# Patient Record
Sex: Male | Born: 1955
Health system: Southern US, Community
[De-identification: ages and names within clinical notes are randomized; demographics above are authoritative.]

## PROBLEM LIST (undated history)

## (undated) DIAGNOSIS — E785 Hyperlipidemia, unspecified: Secondary | ICD-10-CM

## (undated) DIAGNOSIS — I219 Acute myocardial infarction, unspecified: Secondary | ICD-10-CM

## (undated) DIAGNOSIS — I2510 Atherosclerotic heart disease of native coronary artery without angina pectoris: Secondary | ICD-10-CM

## (undated) DIAGNOSIS — I10 Essential (primary) hypertension: Secondary | ICD-10-CM

## (undated) HISTORY — DX: Hyperlipidemia, unspecified: E78.5

## (undated) HISTORY — PX: HERNIA REPAIR: SHX51

## (undated) HISTORY — PX: SINUS SURGERY WITH INSTATRAK: SHX5215

## (undated) HISTORY — DX: Acute myocardial infarction, unspecified: I21.9

## (undated) HISTORY — DX: Essential (primary) hypertension: I10

## (undated) HISTORY — PX: EXPLORATION POST OPERATIVE OPEN HEART: SHX5061

## (undated) HISTORY — PX: CORONARY ANGIOPLASTY: SHX604

---

## 2008-09-22 ENCOUNTER — Ambulatory Visit: Payer: Self-pay

## 2009-03-12 ENCOUNTER — Ambulatory Visit: Payer: Self-pay | Admitting: Gastroenterology

## 2010-07-21 IMAGING — RF DG BARIUM SWALLOW
1 series · 15 of 24 positions shown · non-contrast
Comparison: none

REASON FOR EXAM: dysphagia
COMMENTS:

[Series 1: run · 9 acquisitions, 15 frames shown]
[im 1/9]
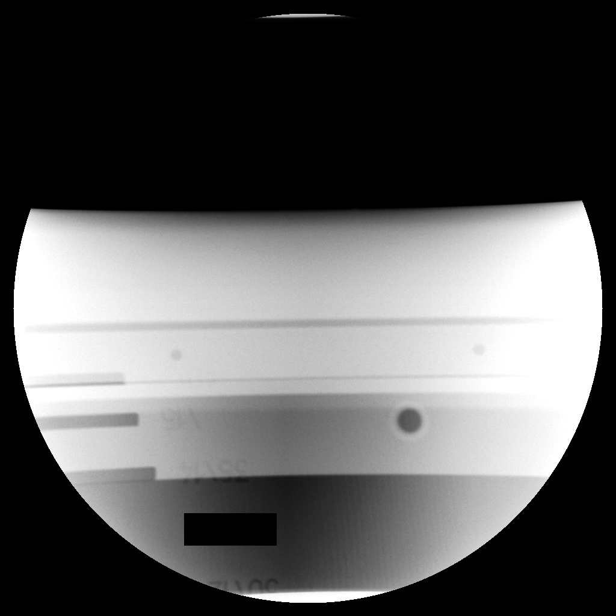
[im 2/9]
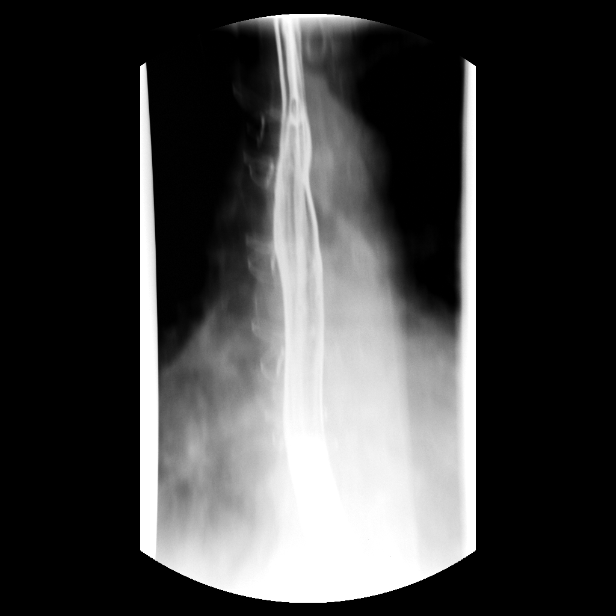
[im 3/9]
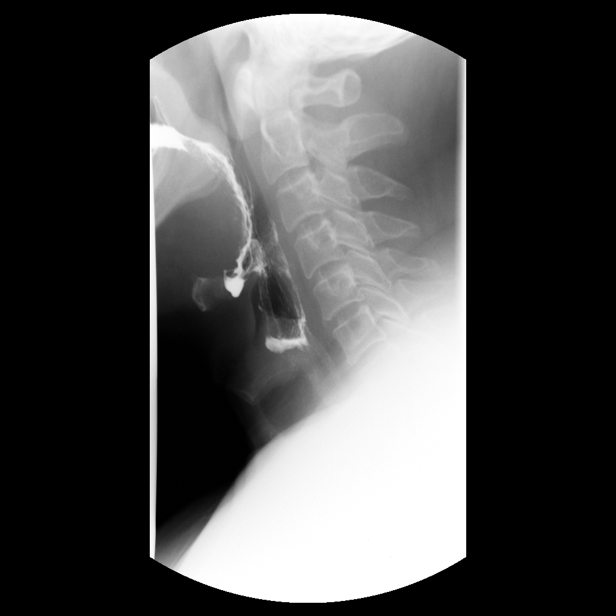
[im 3/9]
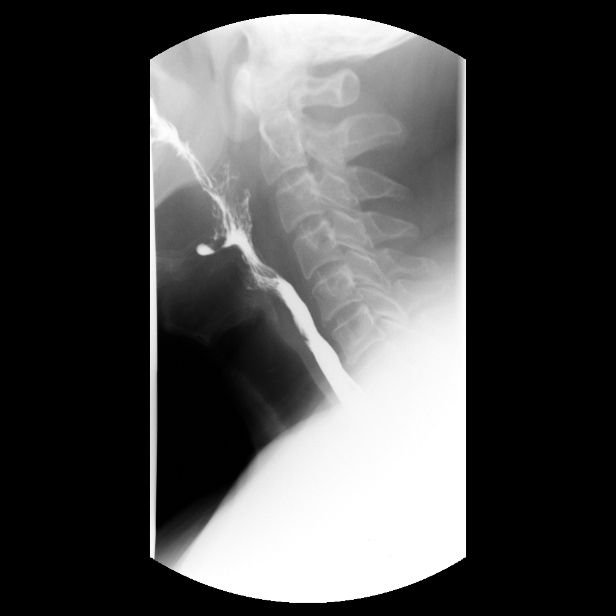
[im 4/9]
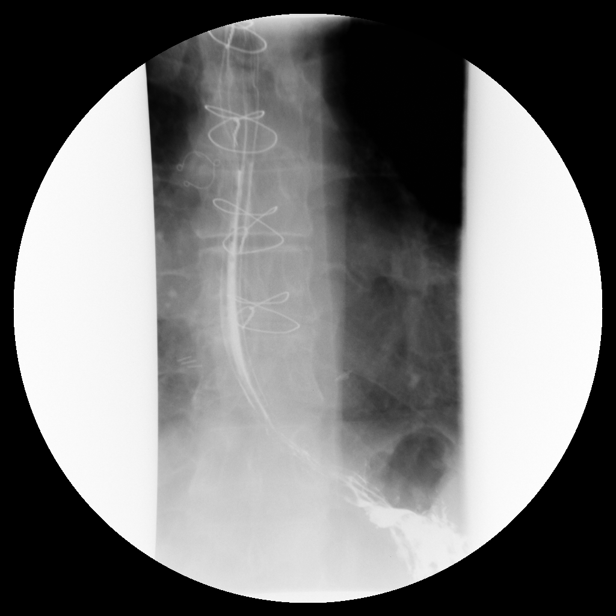
[im 4/9]
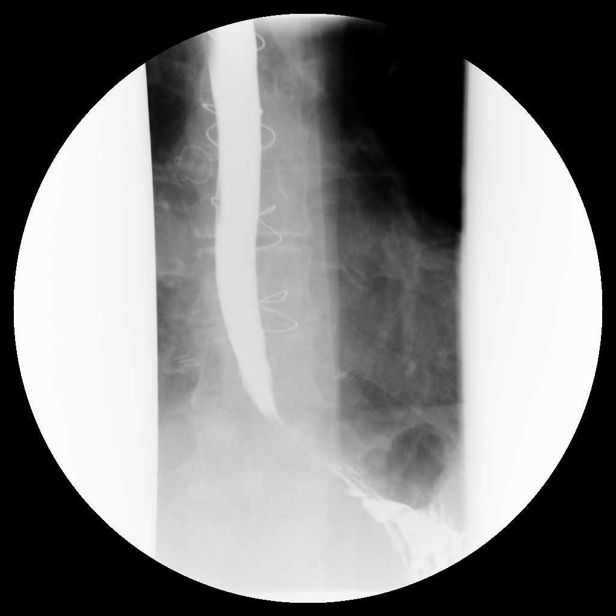
[im 5/9]
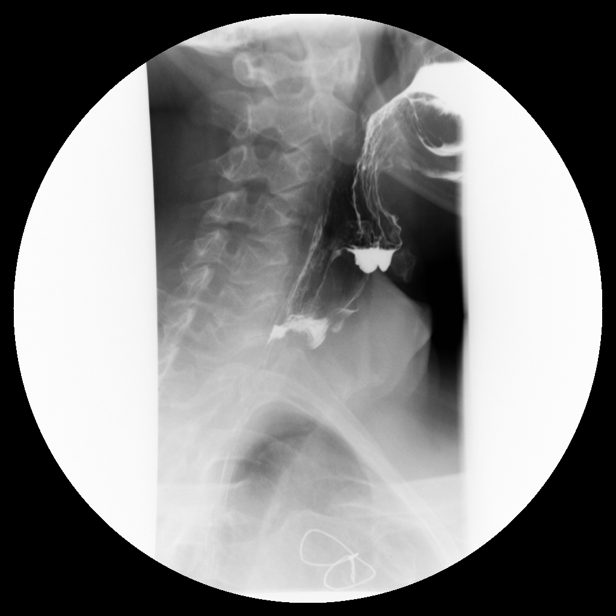
[im 5/9]
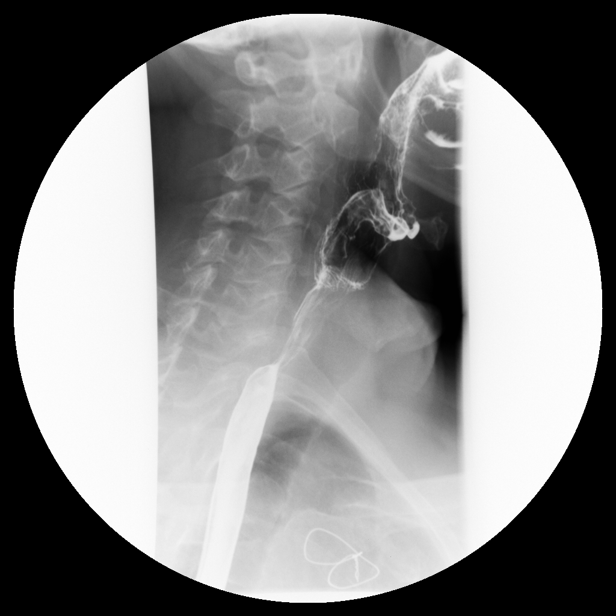
[im 5/9]
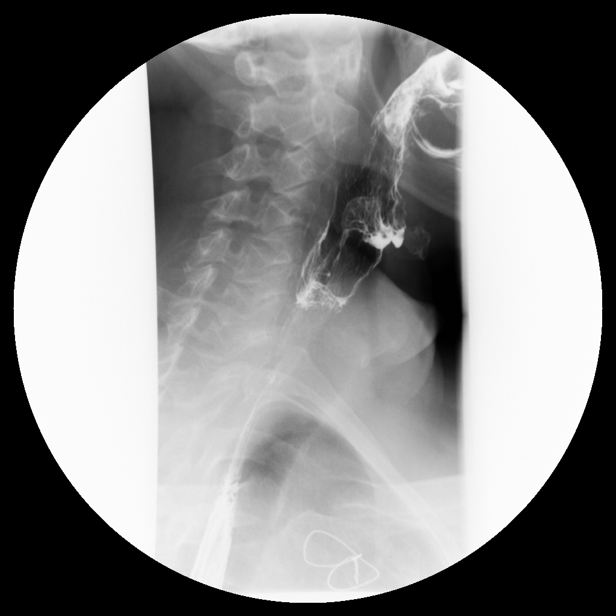
[im 7/9]
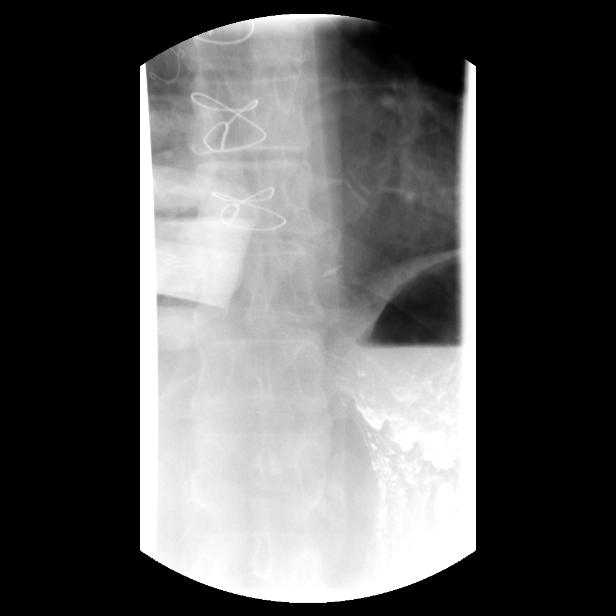
[im 7/9]
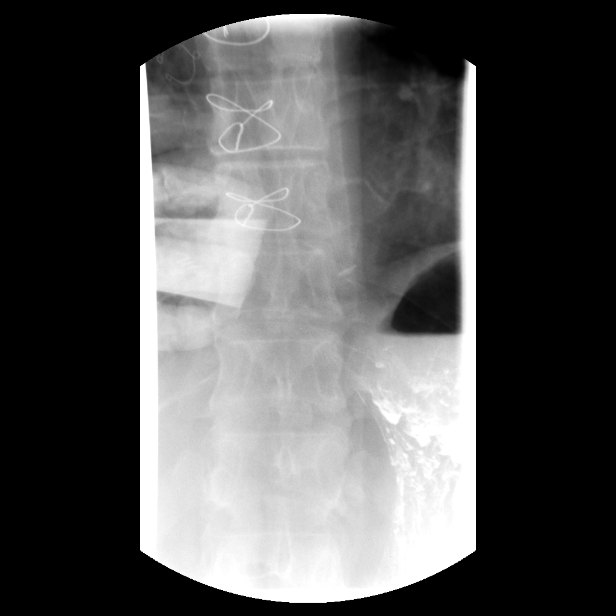
[im 8/9]
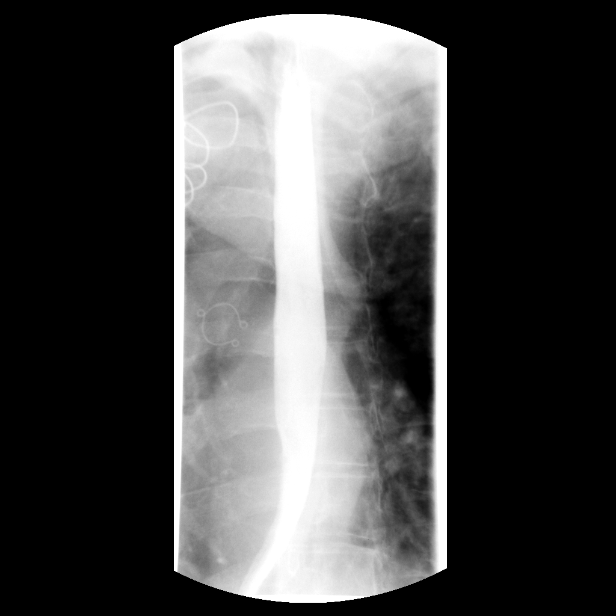
[im 8/9]
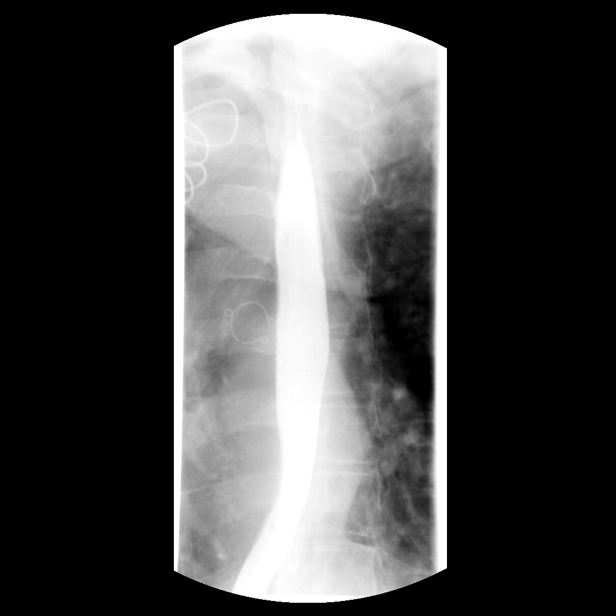
[im 9/9]
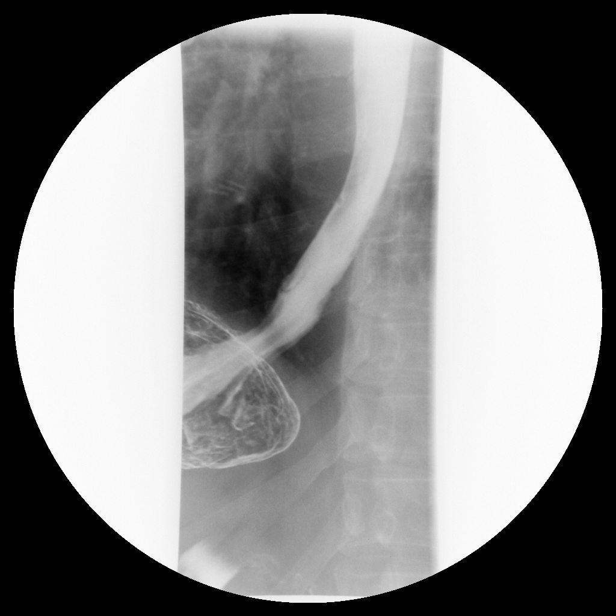
[im 9/9]
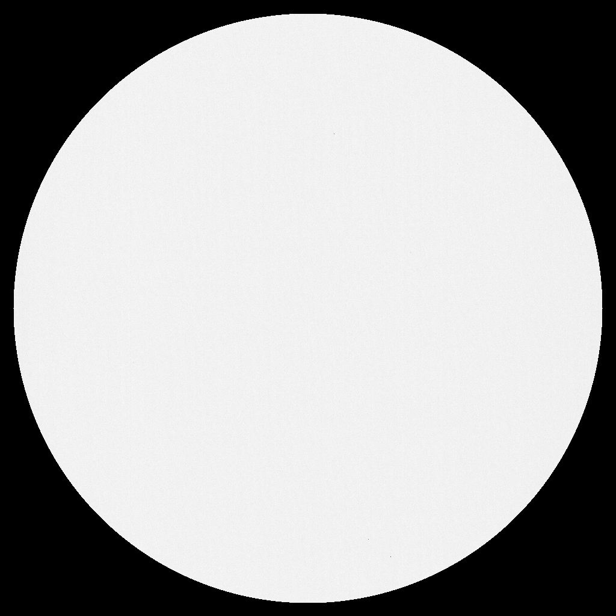

[15 of 24 positions shown; findings below may reference images not displayed]

PROCEDURE:     FL  - FL BARIUM SWALLOW  - September 22, 2008  [DATE]

RESULT:     Barium swallow examination demonstrates the patient easily
ingests the liquid barium. There is no evidence of persistent esophageal
narrowing. There is no evidence of aspiration or penetration of barium into
the upper airway. A 12.5 mm barium impregnated tablet passed easily through
the esophagus into the stomach. There is no evidence of distal stenosis,
hiatal hernia or gastroesophageal reflux.
IMPRESSION: 1.     Unremarkable barium swallow examination. If the patient has
persistent symptoms, consideration could be given for endoscopy.

## 2011-01-08 IMAGING — CR DG CHEST 2V
1 series · 2 of 2 positions shown · non-contrast
Comparison: none

REASON FOR EXAM: esophageal foreign body
COMMENTS:

[Series 1: view not recorded · 0.17mm/px · 2 of 2 slices shown]
[im 1/2]
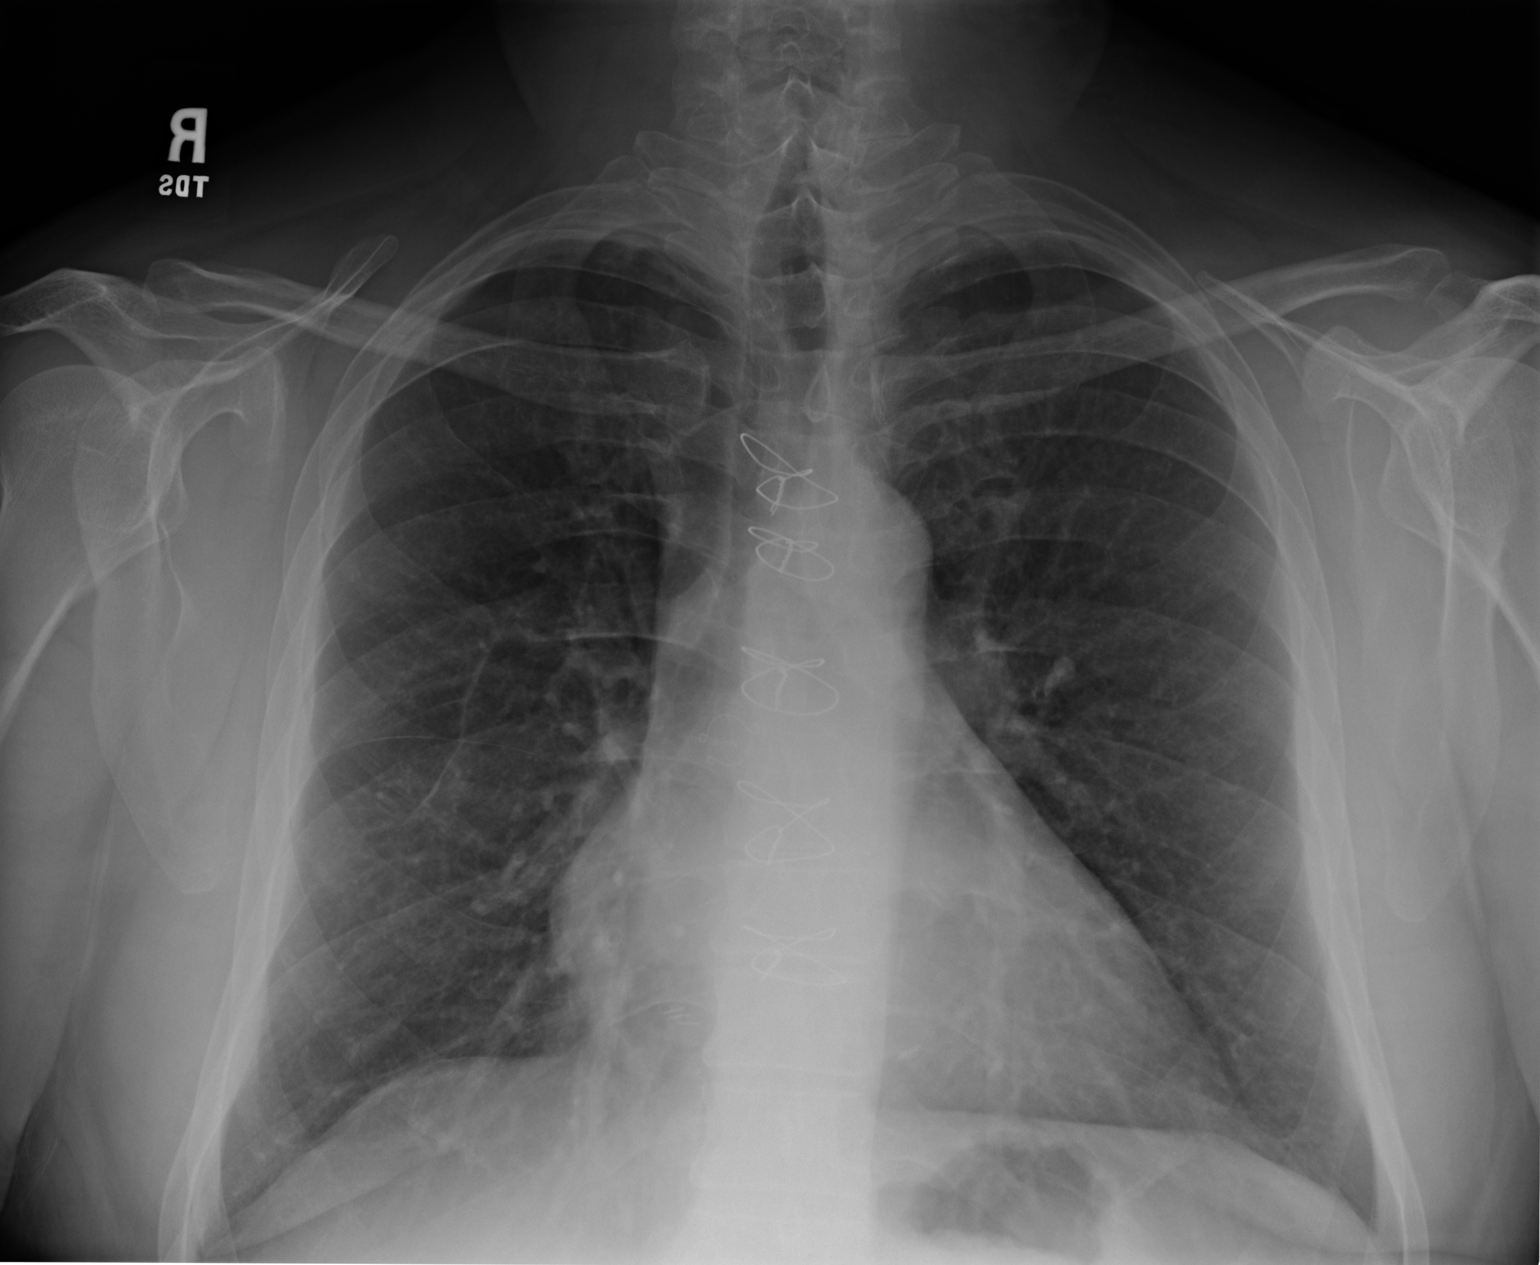
[im 2/2]
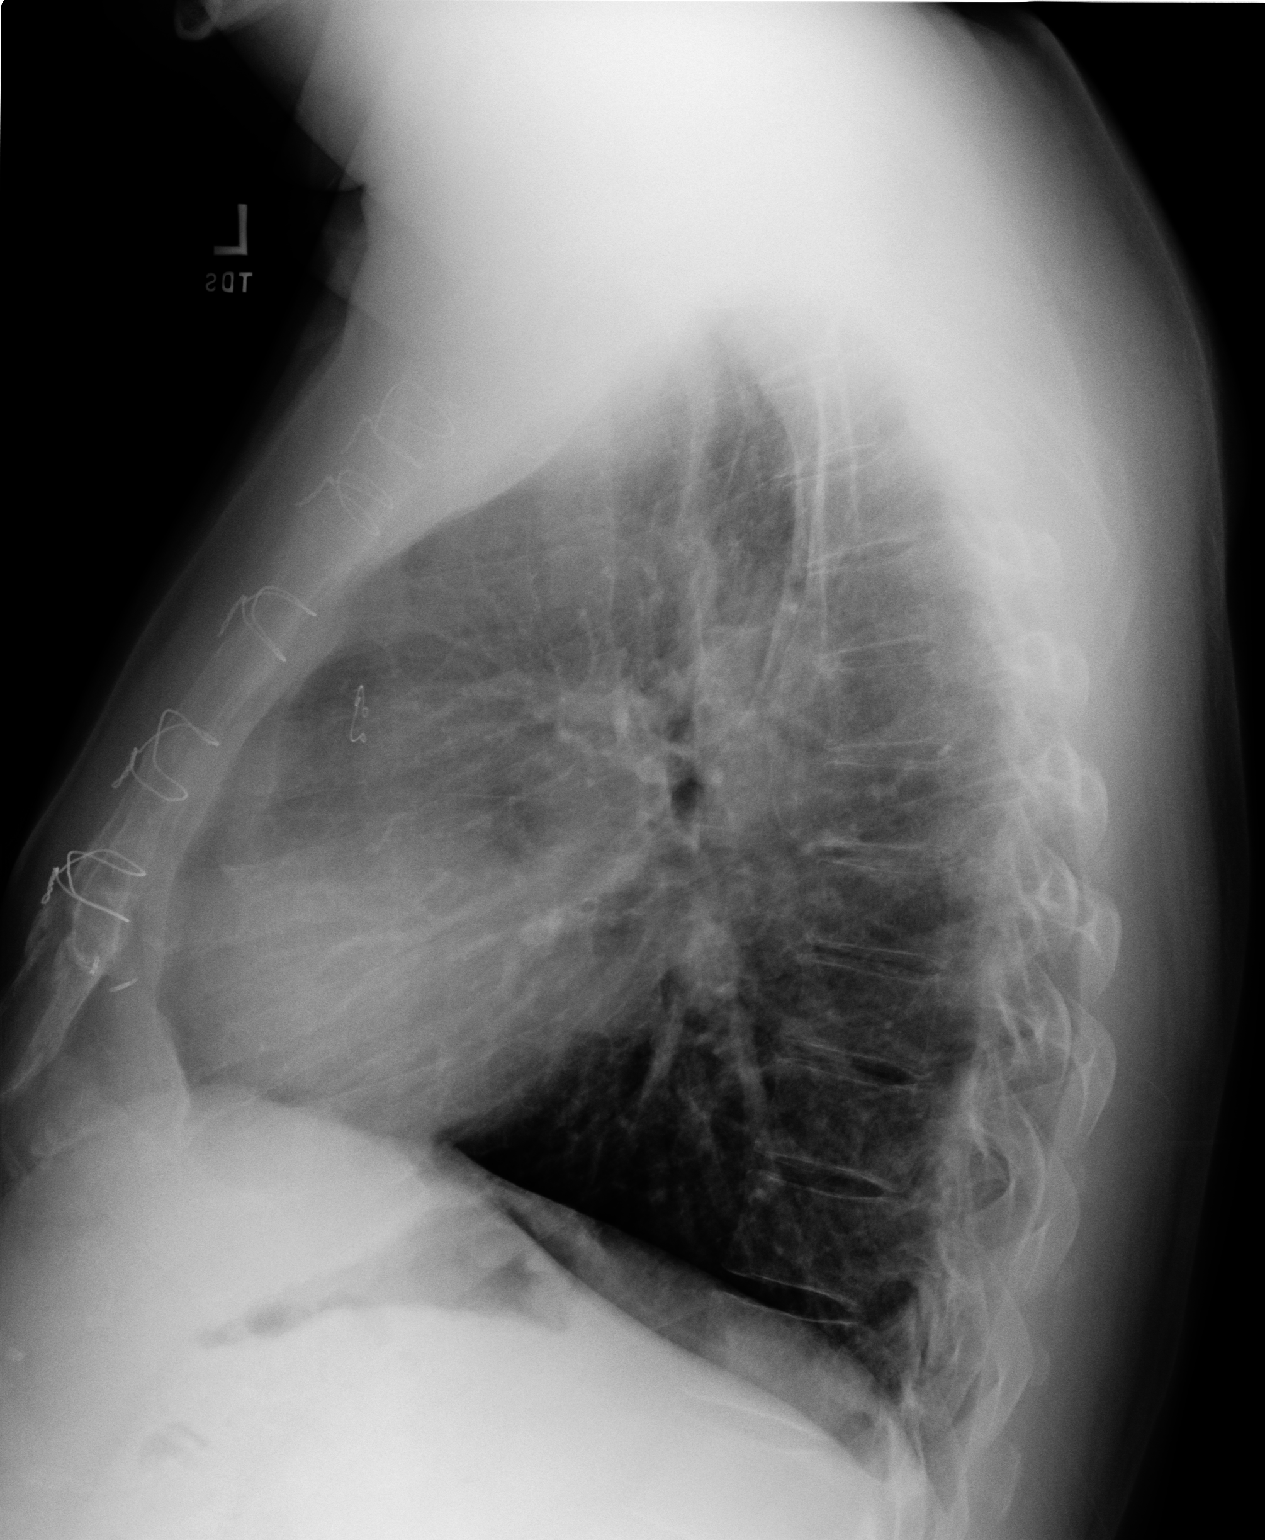

[2 of 2 positions shown; findings below may reference images not displayed]

PROCEDURE:     DXR - DXR CHEST PA (OR AP) AND LATERAL  - March 12, 2009 [DATE]

RESULT:     The lungs are mildly hyperinflated. The interstitial markings
are prominent likely reflecting underlying COPD. The heart is normal in
size. The pulmonary vascularity is not engorged. There is no pleural
effusion. The thoracic vertebral bodies are preserved in height. The patient
has undergone prior median sternotomy.
IMPRESSION: There are findings consistent with COPD. I do not see
evidence of CHF nor of pneumonia.

## 2011-10-15 DIAGNOSIS — E785 Hyperlipidemia, unspecified: Secondary | ICD-10-CM | POA: Insufficient documentation

## 2011-10-15 DIAGNOSIS — I2510 Atherosclerotic heart disease of native coronary artery without angina pectoris: Secondary | ICD-10-CM | POA: Insufficient documentation

## 2014-12-14 ENCOUNTER — Ambulatory Visit: Admit: 2014-12-14 | Disposition: A | Discharge status: Home / Self Care | Payer: Self-pay | Admitting: Gastroenterology

## 2014-12-14 LAB — CBC
HCT: 44.5 % (ref 40.0–52.0)
HGB: 14.8 g/dL (ref 13.0–18.0)
MCH: 30.1 pg (ref 26.0–34.0)
MCHC: 33.3 g/dL (ref 32.0–36.0)
MCV: 90 fL (ref 80–100)
Platelet: 251 10*3/uL (ref 150–440)
RBC: 4.93 10*6/uL (ref 4.40–5.90)
RDW: 12.5 % (ref 11.5–14.5)
WBC: 10.7 10*3/uL — ABNORMAL HIGH (ref 3.8–10.6)

## 2014-12-14 LAB — COMPREHENSIVE METABOLIC PANEL
ALBUMIN: 4.7 g/dL
ALK PHOS: 55 U/L
ALT: 40 U/L
AST: 30 U/L
Anion Gap: 10 (ref 7–16)
BUN: 15 mg/dL
Bilirubin,Total: 0.4 mg/dL
CALCIUM: 9.7 mg/dL
CREATININE: 0.99 mg/dL
Chloride: 105 mmol/L
Co2: 25 mmol/L
EGFR (African American): >60
EGFR (Non-African Amer.): >60
Glucose: 107 mg/dL — ABNORMAL HIGH
Potassium: 3.9 mmol/L
SODIUM: 140 mmol/L
TOTAL PROTEIN: 7.9 g/dL

## 2014-12-14 LAB — PROTIME-INR
INR: 1
Prothrombin Time: 13.4 secs

## 2016-02-19 ENCOUNTER — Telehealth: Payer: Self-pay | Admitting: Podiatry

## 2016-02-19 NOTE — Telephone Encounter (Signed)
Pt will be a new pt and wife states that he has a red and swollen toe

## 2016-02-19 NOTE — Telephone Encounter (Signed)
Pt has a red painful toe.  I told pt to do epsom salt warm water soaks, and cover with a Neosporin bandaid, do this daily until seen.  I told pt I would have an appt scheduler call tomorrow to get him in as soon as possible.  I told pt if he worsened to go to ER or PCP.  Pt states understanding.

## 2016-02-20 ENCOUNTER — Ambulatory Visit (INDEPENDENT_AMBULATORY_CARE_PROVIDER_SITE_OTHER): Payer: 59 | Admitting: Sports Medicine

## 2016-02-20 ENCOUNTER — Encounter: Payer: Self-pay | Admitting: Sports Medicine

## 2016-02-20 DIAGNOSIS — M79674 Pain in right toe(s): Secondary | ICD-10-CM | POA: Diagnosis not present

## 2016-02-20 DIAGNOSIS — L03031 Cellulitis of right toe: Secondary | ICD-10-CM

## 2016-02-20 DIAGNOSIS — L03011 Cellulitis of right finger: Secondary | ICD-10-CM

## 2016-02-20 MED ORDER — AMOXICILLIN-POT CLAVULANATE 875-125 MG PO TABS: 1.0000 | ORAL_TABLET | Freq: Two times a day (BID) | ORAL | Status: DC

## 2016-02-20 NOTE — Progress Notes (Signed)
Patient ID: Lee Douglas, male   DOB: 1956-06-08, 60 y.o.   MRN: [id number] Subjective: Lee Douglas is a 60 y.o. male patient presents to office today complaining of a painful incurvated, red, hot, swollen medial nail border of the 1st toe on the right foot. This has been present for >2 weeks Patient has treated this by trimming, soaking with peroxide, and epsom salt. Patient denies fever/chills/nausea/vomitting/any other related constitutional symptoms at this time.  Patient Active Problem List   Diagnosis Date Noted  . Arteriosclerosis of coronary artery 10/15/2011  . Dyslipidemia 10/15/2011    No current outpatient prescriptions on file prior to visit.   No current facility-administered medications on file prior to visit.    Allergies  Allergen Reactions  . Codeine Nausea Only    Objective:  There were no vitals filed for this visit.  General: Well developed, nourished, in no acute distress, alert and oriented x3   Dermatology: Skin is warm, dry and supple bilateral. Right hallux nail appears to be  severely incurvated with granuloma and hyperkeratosis formation at the distal aspects of  the medial nail border. (+) Erythema. (+) Edema. (-) serosanguous drainage present. The remaining nails appear unremarkable at this time. There are no open sores, lesions or other signs of infection present.  Vascular: Dorsalis Pedis artery and Posterior Tibial artery pedal pulses are 2/4 bilateral with immedate capillary fill time. Pedal hair growth present. No lower extremity edema.   Neruologic: Grossly intact via light touch bilateral.  Musculoskeletal: Tenderness to palpation of the right hallux medial nail fold. Muscular strength within normal limits in all groups bilateral.   Assesement and Plan: Problem List Items Addressed This Visit    None    Visit Diagnoses    Paronychia, right    -  Primary    Hallux medial margin    Relevant Medications    amoxicillin-clavulanate (AUGMENTIN)  875-125 MG tablet    Toe pain, right        Relevant Medications    amoxicillin-clavulanate (AUGMENTIN) 875-125 MG tablet       -Discussed treatment alternatives and plan of care; Explained permanent/temporary nail avulsion and post procedure course to patient. Patient opt for PNA Right hallux medial margin  - After a verbal consent, injected 3 ml of a 50:50 mixture of 2% plain  lidocaine and 0.5% plain marcaine in a normal hallux block fashion. Next, a  betadine prep was performed. Anesthesia was tested and found to be appropriate.  The offending right hallux medial nail border was then incised from the hyponychium to the epinychium. The offending nail border was removed and cleared from the field. The area was curretted for any remaining nail or spicules. Phenol application performed and the area was then flushed with alcohol and dressed with antibiotic cream and a dry sterile dressing. -Patient was instructed to leave the dressing intact for today and begin soaking  in a weak solution of betadine and water tomorrow. Patient was instructed to  soak for 15 minutes each day and apply neosporin and a gauze or bandaid dressing each day. -Patient was instructed to monitor the toe for signs of infection and return to office if toe becomes red, hot or swollen. -Advised ice, elevation, and tylenol or motrin if needed for pain.  -Patient is to return in 1-2 weeks for follow up care or sooner if problems arise.  Landis Martins, DPM

## 2016-02-20 NOTE — Patient Instructions (Signed)

## 2016-03-08 ENCOUNTER — Ambulatory Visit (INDEPENDENT_AMBULATORY_CARE_PROVIDER_SITE_OTHER): Payer: 59 | Admitting: Sports Medicine

## 2016-03-08 ENCOUNTER — Encounter: Payer: Self-pay | Admitting: Sports Medicine

## 2016-03-08 DIAGNOSIS — M79674 Pain in right toe(s): Secondary | ICD-10-CM

## 2016-03-08 DIAGNOSIS — Z9889 Other specified postprocedural states: Secondary | ICD-10-CM

## 2016-03-08 NOTE — Progress Notes (Signed)
Patient ID: THO RADICE, male   DOB: 02-Jun-1956, 60 y.o.   MRN: [id number] Subjective: Lee Douglas is a 60 y.o. male patient returns to office today for follow up evaluation after having Right Hallux medial permanent nail avulsion performed on 02-20-16. Patient has been soaking using betadine and applying topical antibiotic covered with bandaid daily. Patient deniesfever/chills/nausea/vomitting/any other related constitutional symptoms at this time.  Patient Active Problem List   Diagnosis Date Noted  . Arteriosclerosis of coronary artery 10/15/2011  . Dyslipidemia 10/15/2011    Current Outpatient Prescriptions on File Prior to Visit  Medication Sig Dispense Refill  . amoxicillin-clavulanate (AUGMENTIN) 875-125 MG tablet Take 1 tablet by mouth 2 (two) times daily. 20 tablet 0  . aspirin 81 MG tablet Take by mouth.    Marland Kitchen atorvastatin (LIPITOR) 80 MG tablet Take by mouth.    Marland Kitchen atorvastatin (LIPITOR) 80 MG tablet     . clopidogrel (PLAVIX) 75 MG tablet Take by mouth.    . clopidogrel (PLAVIX) 75 MG tablet     . lisinopril (PRINIVIL,ZESTRIL) 10 MG tablet Take by mouth.    Marland Kitchen lisinopril (PRINIVIL,ZESTRIL) 10 MG tablet     . metoprolol tartrate (LOPRESSOR) 25 MG tablet Take by mouth.    . Multiple Vitamins-Minerals (MULTIVITAMIN ADULT PO) Take by mouth.    . nitroGLYCERIN (NITROSTAT) 0.4 MG SL tablet Place under the tongue.    . nitroGLYCERIN (NITROSTAT) 0.4 MG SL tablet     . Omega-3 1000 MG CAPS Take by mouth.     No current facility-administered medications on file prior to visit.    Allergies  Allergen Reactions  . Codeine Nausea Only    Objective:  General: Well developed, nourished, in no acute distress, alert and oriented x3   Dermatology: Skin is warm, dry and supple bilateral. Right hallux medial nail bed appears to be clean, dry, with mild granular tissue and surrounding eschar/scab. (-) Erythema. (-) Edema. (-) serosanguous drainage present. The remaining nails appear  unremarkable at this time. There are no other lesions or other signs of infection  present.  Neurovascular status: Intact. No lower extremity swelling; No pain with calf compression bilateral.  Musculoskeletal: Decreased tenderness to palpation of the Right hallux medial nail fold. Muscular strength within normal limits bilateral.   Assesement and Plan: Problem List Items Addressed This Visit    None    Visit Diagnoses    S/P nail surgery    -  Primary    Toe pain, right          -Examined patient  -Cleansed right hallux medial nail fold and gently scrubbed with peroxide and q-tip/curetted away eschar at site and applied antibiotic cream covered with bandaid.  -Discussed plan of care with patient. -Patient to now begin soaking in a weak solution of Epsom salt and warm water. Patient was instructed to soak for 15-20 minutes each day until the toe appears normal and there is no drainage, redness, tenderness, or swelling at the procedure site, and apply neosporin and a gauze or bandaid dressing each day as needed. May leave open to air at night. -Educated patient on long term care after nail surgery. -Patient was instructed to monitor the toe for reoccurrence and signs of infection; Patient advised to return to office or go to ER if toe becomes red, hot or swollen. -Patient is to return as needed or sooner if problems arise.  Landis Martins, DPM

## 2016-09-24 DIAGNOSIS — I10 Essential (primary) hypertension: Secondary | ICD-10-CM | POA: Diagnosis not present

## 2016-09-24 DIAGNOSIS — I2510 Atherosclerotic heart disease of native coronary artery without angina pectoris: Secondary | ICD-10-CM | POA: Diagnosis not present

## 2016-10-11 IMAGING — CR NECK SOFT TISSUES - 1+ VIEW
1 series · 2 of 2 positions shown · non-contrast
Comparison: None.

CLINICAL DATA: Patient was eating piece if tenderloin and feels
like it stuck at jugular notch region.

EXAM:
NECK SOFT TISSUES - 1+ VIEW

[Series 1: dxr soft tissue neck · 0.14mm/px · 2 of 2 slices shown]
[im 1/2]
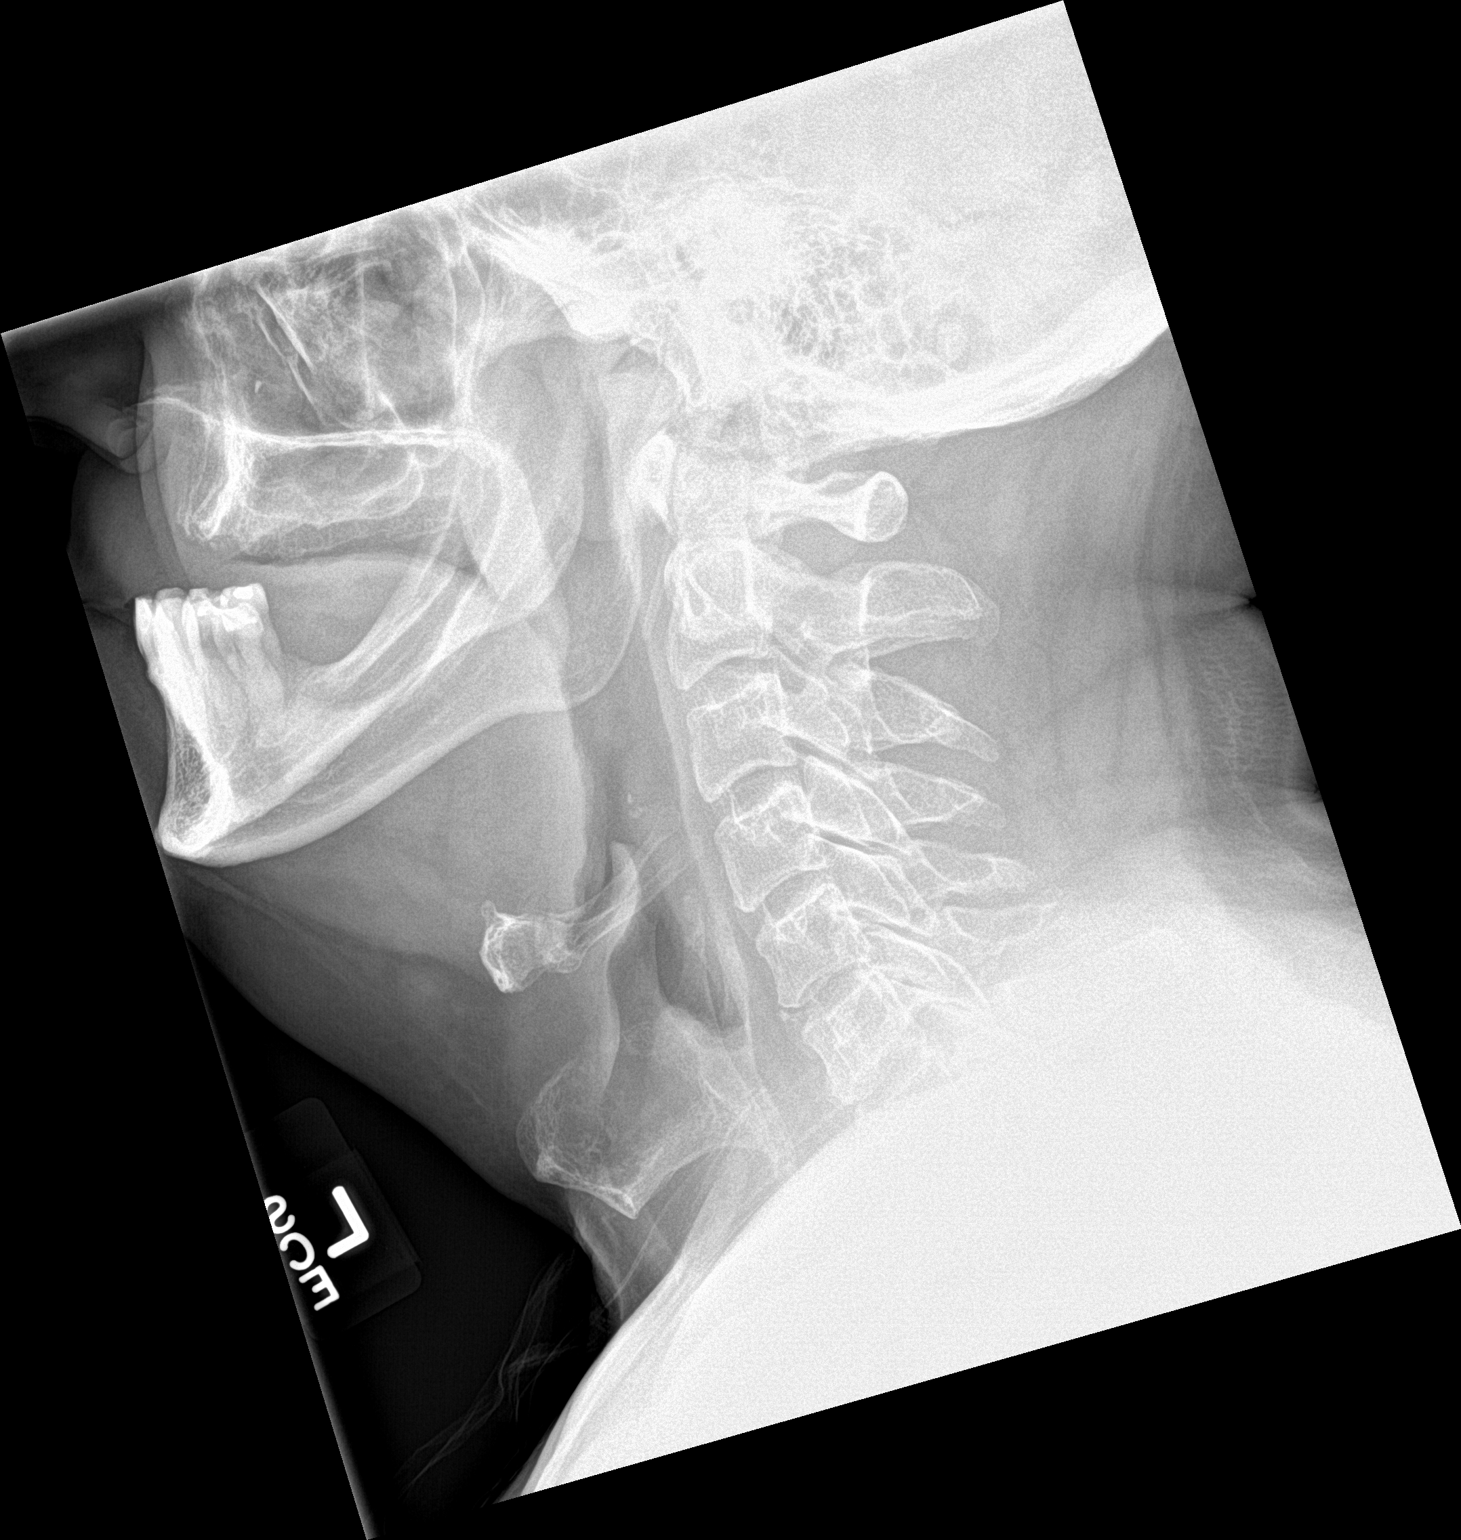
[im 2/2]
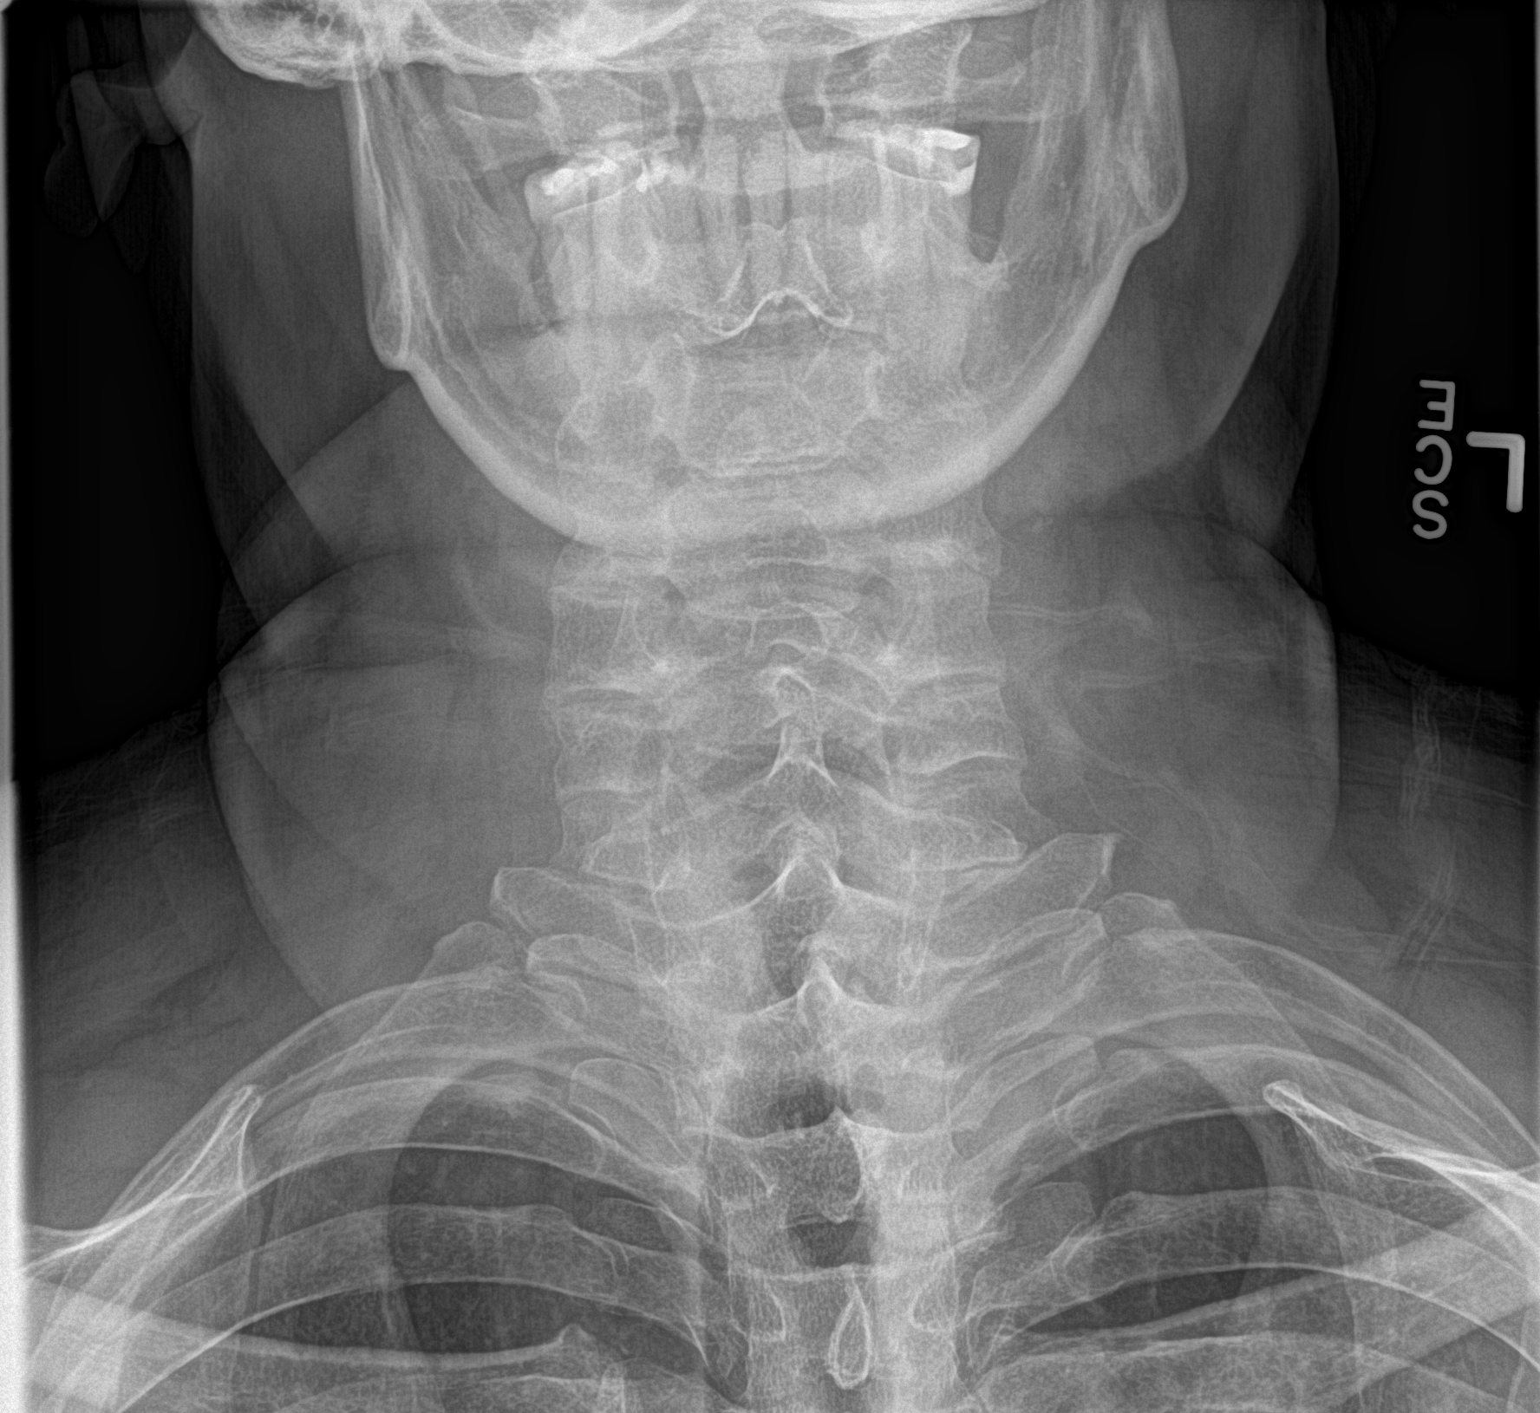

[2 of 2 positions shown; findings below may reference images not displayed]

FINDINGS: There is no evidence of retropharyngeal soft tissue swelling or
epiglottic enlargement. The cervical airway is unremarkable and no
radio-opaque foreign body identified.
IMPRESSION: Negative.

## 2017-01-27 DIAGNOSIS — E785 Hyperlipidemia, unspecified: Secondary | ICD-10-CM | POA: Diagnosis not present

## 2017-01-27 DIAGNOSIS — I25119 Atherosclerotic heart disease of native coronary artery with unspecified angina pectoris: Secondary | ICD-10-CM | POA: Diagnosis not present

## 2017-06-03 DIAGNOSIS — I10 Essential (primary) hypertension: Secondary | ICD-10-CM | POA: Diagnosis not present

## 2017-10-03 ENCOUNTER — Ambulatory Visit: Payer: Self-pay | Admitting: Nurse Practitioner

## 2018-01-23 DIAGNOSIS — E785 Hyperlipidemia, unspecified: Secondary | ICD-10-CM | POA: Diagnosis not present

## 2018-01-23 DIAGNOSIS — I25119 Atherosclerotic heart disease of native coronary artery with unspecified angina pectoris: Secondary | ICD-10-CM | POA: Diagnosis not present

## 2018-02-10 ENCOUNTER — Ambulatory Visit: Payer: 59 | Admitting: Nurse Practitioner

## 2018-02-10 ENCOUNTER — Encounter: Payer: Self-pay | Admitting: Nurse Practitioner

## 2018-02-10 VITALS — BP 108/78 | HR 65 | Temp 98.3°F | Resp 16 | Ht 71.0 in | Wt 263.6 lb

## 2018-02-10 DIAGNOSIS — R05 Cough: Secondary | ICD-10-CM | POA: Diagnosis not present

## 2018-02-10 DIAGNOSIS — R059 Cough, unspecified: Secondary | ICD-10-CM

## 2018-02-10 DIAGNOSIS — J0140 Acute pansinusitis, unspecified: Secondary | ICD-10-CM | POA: Diagnosis not present

## 2018-02-10 DIAGNOSIS — I10 Essential (primary) hypertension: Secondary | ICD-10-CM

## 2018-02-10 MED ORDER — AMOXICILLIN-POT CLAVULANATE 875-125 MG PO TABS: 1.0000 | ORAL_TABLET | Freq: Two times a day (BID) | ORAL | 0 refills | Status: DC

## 2018-02-10 NOTE — Progress Notes (Signed)
Lee Douglas Lee Douglas, Lee Douglas [id number]  Internal MEDICINE  Office Visit Note  Patient Name: Lee Douglas  [id number]  [id number]  Date of Service: 03/01/2018   Pt is here for a sick visit.  Chief Complaint  Patient presents with  . Nasal Congestion  . Cough     Cough  This is a new problem. The current episode started 1 to 4 weeks ago. The problem has been waxing and waning. The problem occurs constantly. The cough is non-productive. Associated symptoms include ear congestion, headaches, nasal congestion, postnasal drip, rhinorrhea and a sore throat. Pertinent negatives include no chest pain, chills, eye redness, fever, rash, shortness of breath or wheezing. The symptoms are aggravated by pollens. He has tried OTC cough suppressant (mucinex) for the symptoms. The treatment provided mild relief. His past medical history is significant for environmental allergies.        Current Medication:  Outpatient Encounter Medications as of 02/10/2018  Medication Sig Note  . aspirin 81 MG tablet Take by mouth. 02/20/2016: Received from: [facility]  . atorvastatin (LIPITOR) 80 MG tablet  02/20/2016: Received from: External Pharmacy  . dextromethorphan (DELSYM) 30 MG/5ML liquid Take by mouth as needed for cough.   Marland Kitchen guaiFENesin (MUCINEX) 600 MG 12 hr tablet Take by mouth 2 (two) times daily.   Marland Kitchen lisinopril (PRINIVIL,ZESTRIL) 10 MG tablet  02/20/2016: Received from: External Pharmacy  . Multiple Vitamins-Minerals (MULTIVITAMIN ADULT PO) Take by mouth. 02/20/2016: Received from: [facility]  . nitroGLYCERIN (NITROSTAT) 0.4 MG SL tablet  02/20/2016: Received from: External Pharmacy  . amoxicillin-clavulanate (AUGMENTIN) 875-125 MG tablet Take 1 tablet by mouth 2 (two) times daily.   Marland Kitchen atorvastatin (LIPITOR) 80 MG tablet Take by mouth. 02/20/2016: Received from: [facility]  . clopidogrel (PLAVIX) 75 MG tablet  02/20/2016:  Received from: External Pharmacy  . lisinopril (PRINIVIL,ZESTRIL) 10 MG tablet Take by mouth. 02/20/2016: Received from: [facility]  . metoprolol tartrate (LOPRESSOR) 25 MG tablet Take by mouth. 02/20/2016: Received from: [facility]  . nitroGLYCERIN (NITROSTAT) 0.4 MG SL tablet Place under the tongue. 02/20/2016: Received from: [facility]  . Omega-3 1000 MG CAPS Take by mouth. 02/20/2016: Received from: [facility]  . [DISCONTINUED] amoxicillin-clavulanate (AUGMENTIN) 875-125 MG tablet Take 1 tablet by mouth 2 (two) times daily.    No facility-administered encounter medications on file as of 02/10/2018.       Medical History: Past Medical History:  Diagnosis Date  . Hyperlipidemia   . Hypertension      Today's Vitals   02/10/18 1118  BP: 108/78  Pulse: 65  Resp: 16  Temp: 98.3 F (36.8 C)  SpO2: 94%  Weight: 263 lb 9.6 oz (119.6 kg)  Height: 5\' 11"  (1.803 m)    Review of Systems  Constitutional: Positive for fatigue. Negative for chills, fever and unexpected weight change.  HENT: Positive for congestion, postnasal drip, rhinorrhea, sneezing and sore throat.   Eyes: Negative.  Negative for redness.  Respiratory: Positive for cough. Negative for chest tightness, shortness of breath and wheezing.   Cardiovascular: Negative for chest pain and palpitations.  Gastrointestinal: Negative for abdominal pain, constipation, diarrhea, nausea and vomiting.  Endocrine: Negative for cold intolerance, heat intolerance, polydipsia, polyphagia and polyuria.  Genitourinary: Negative.  Negative for dysuria and frequency.  Musculoskeletal: Negative for arthralgias, back pain, joint swelling and neck pain.  Skin: Negative for rash.  Allergic/Immunologic:  Positive for environmental allergies.  Neurological: Positive for headaches. Negative for tremors and numbness.  Hematological: Negative for adenopathy. Does not bruise/bleed  easily.  Psychiatric/Behavioral: Negative for behavioral problems (Depression), dysphoric mood, sleep disturbance and suicidal ideas. The patient is not nervous/anxious.     Physical Exam  Constitutional: He is oriented to person, place, and time. He appears well-developed and well-nourished. No distress.  HENT:  Head: Normocephalic and atraumatic.  Right Ear: Tympanic membrane is bulging.  Left Ear: Tympanic membrane is bulging.  Nose: Rhinorrhea present. Right sinus exhibits frontal sinus tenderness. Left sinus exhibits frontal sinus tenderness.  Mouth/Throat: Posterior oropharyngeal erythema present. No oropharyngeal exudate.  Eyes: Pupils are equal, round, and reactive to light. Conjunctivae and EOM are normal.  Neck: Normal range of motion. Neck supple. No JVD present. No tracheal deviation present. No thyromegaly present.  Cardiovascular: Normal rate, regular rhythm and normal heart sounds. Exam reveals no gallop and no friction rub.  No murmur heard. Pulmonary/Chest: Effort normal and breath sounds normal. No respiratory distress. He has no wheezes. He has no rales. He exhibits no tenderness.  Congested,, non-productive cough  Abdominal: Soft. Bowel sounds are normal. There is no tenderness.  Musculoskeletal: Normal range of motion.  Lymphadenopathy:    He has cervical adenopathy.  Neurological: He is alert and oriented to person, place, and time. No cranial nerve deficit.  Skin: Skin is warm and dry. He is not diaphoretic.  Psychiatric: He has a normal mood and affect. His behavior is normal. Judgment and thought content normal.  Nursing note and vitals reviewed.  Assessment/Plan: 1. Acute non-recurrent pansinusitis Start augmentin 875mg  twice daily for 10 days. Rest and increase fluids. Take OTC medication to alleiate symptoms.  - amoxicillin-clavulanate (AUGMENTIN) 875-125 MG tablet; Take 1 tablet by mouth 2 (two) times daily.  Dispense: 20 tablet; Refill: 0  2.  Cough Recommend OTC delsym twice daily as needed for cough.   3. Essential hypertension Generally stable. Continue bp medication as prescribed.   General Counseling: [name] verbalizes understanding of the findings of todays visit and agrees with plan of treatment. I have discussed any further diagnostic evaluation that may be needed or ordered today. We also reviewed his medications today. he has been encouraged to call the office with any questions or concerns that should arise related to todays visit.    Counseling:  Rest and increase fluids. Continue using OTC medication to control symptoms.   This patient was seen by Leretha Pol, FNP- C in Collaboration with Dr Lavera Guise as a part of collaborative care agreement    Meds ordered this encounter  Medications  . amoxicillin-clavulanate (AUGMENTIN) 875-125 MG tablet    Sig: Take 1 tablet by mouth 2 (two) times daily.    Dispense:  20 tablet    Refill:  0    Order Specific Question:   Supervising Provider    Answer:   Lavera Guise [[id number]]    Time spent: 15 Minutes

## 2018-03-01 DIAGNOSIS — J0140 Acute pansinusitis, unspecified: Secondary | ICD-10-CM | POA: Insufficient documentation

## 2018-03-01 DIAGNOSIS — I10 Essential (primary) hypertension: Secondary | ICD-10-CM | POA: Insufficient documentation

## 2018-03-01 DIAGNOSIS — R05 Cough: Secondary | ICD-10-CM | POA: Insufficient documentation

## 2018-03-01 DIAGNOSIS — R059 Cough, unspecified: Secondary | ICD-10-CM | POA: Insufficient documentation

## 2018-06-05 ENCOUNTER — Other Ambulatory Visit: Payer: Self-pay

## 2018-06-05 ENCOUNTER — Encounter: Payer: Self-pay | Admitting: Adult Health

## 2018-06-05 ENCOUNTER — Ambulatory Visit: Payer: 59 | Admitting: Adult Health

## 2018-06-05 VITALS — BP 130/78 | HR 75 | Resp 16 | Ht 71.0 in | Wt 259.0 lb

## 2018-06-05 DIAGNOSIS — I10 Essential (primary) hypertension: Secondary | ICD-10-CM | POA: Diagnosis not present

## 2018-06-05 DIAGNOSIS — F17200 Nicotine dependence, unspecified, uncomplicated: Secondary | ICD-10-CM

## 2018-06-05 DIAGNOSIS — Z0289 Encounter for other administrative examinations: Secondary | ICD-10-CM

## 2018-06-05 DIAGNOSIS — E785 Hyperlipidemia, unspecified: Secondary | ICD-10-CM | POA: Diagnosis not present

## 2018-06-05 DIAGNOSIS — Z1211 Encounter for screening for malignant neoplasm of colon: Secondary | ICD-10-CM

## 2018-06-05 DIAGNOSIS — E669 Obesity, unspecified: Secondary | ICD-10-CM | POA: Diagnosis not present

## 2018-06-05 NOTE — Progress Notes (Signed)
[facility] [place], [place] [address]  Internal MEDICINE  Office Visit Note  Patient Name: Lee Douglas  [id number]  [id number]  Date of Service: 06/05/2018  Chief Complaint  Patient presents with  . Hypertension    health screening form for [facility]  . Hyperlipidemia  . Quality Metric Gaps    colonoscopy needed     HPI PT here for follow up, HLD, HTN. He is also here for health screening paperwork to be filled out for his insurance company.  He denies current issues.  He is due for a colonoscopy at this time, and would like to do Cologuard. He reports he chews tobacco, one or two packs a week.  He reports drinking very little alcohol.  Denies illicit drug use.      Current Medication: Outpatient Encounter Medications as of 06/05/2018  Medication Sig Note  . aspirin 81 MG tablet Take by mouth. 02/20/2016: Received from: [facility]  . atorvastatin (LIPITOR) 80 MG tablet  02/20/2016: Received from: External Pharmacy  . clopidogrel (PLAVIX) 75 MG tablet  02/20/2016: Received from: External Pharmacy  . lisinopril (PRINIVIL,ZESTRIL) 10 MG tablet  02/20/2016: Received from: External Pharmacy  . metoprolol tartrate (LOPRESSOR) 25 MG tablet Take 25 mg by mouth 2 (two) times daily.    . Multiple Vitamins-Minerals (MULTIVITAMIN ADULT PO) Take by mouth. 02/20/2016: Received from: [place]  . nitroGLYCERIN (NITROSTAT) 0.4 MG SL tablet  02/20/2016: Received from: External Pharmacy  . Omega-3 1000 MG CAPS Take by mouth. 02/20/2016: Received from: [place]  . [DISCONTINUED] amoxicillin-clavulanate (AUGMENTIN) 875-125 MG tablet Take 1 tablet by mouth 2 (two) times daily.   . [DISCONTINUED] atorvastatin (LIPITOR) 80 MG tablet Take by mouth. 02/20/2016: Received from: [place]  . [DISCONTINUED] dextromethorphan (DELSYM) 30 MG/5ML liquid Take by mouth as needed for cough.   . [DISCONTINUED] guaiFENesin  (MUCINEX) 600 MG 12 hr tablet Take by mouth 2 (two) times daily.   . [DISCONTINUED] lisinopril (PRINIVIL,ZESTRIL) 10 MG tablet Take by mouth. 02/20/2016: Received from: [place]  . [DISCONTINUED] metoprolol tartrate (LOPRESSOR) 25 MG tablet Take by mouth 2 (two) times daily.  02/20/2016: Received from: [place]  . [DISCONTINUED] nitroGLYCERIN (NITROSTAT) 0.4 MG SL tablet Place under the tongue. 02/20/2016: Received from: [place]   No facility-administered encounter medications on file as of 06/05/2018.     Surgical History: Past Surgical History:  Procedure Laterality Date  . EXPLORATION POST OPERATIVE OPEN HEART    . HERNIA REPAIR    . SINUS SURGERY WITH INSTATRAK      Medical History: Past Medical History:  Diagnosis Date  . Hyperlipidemia   . Hypertension     Family History: Family History  Problem Relation Age of Onset  . Stroke Father     Social History   Socioeconomic History  . Marital status: Married    Spouse name: Not on file  . Number of children: Not on file  . Years of education: Not on file  . Highest education level: Not on file  Occupational History  . Not on file  Social Needs  . Financial resource strain: Not on file  . Food insecurity:    Worry: Not on file    Inability: Not on file  . Transportation needs:    Medical: Not on file    Non-medical: Not on file  Tobacco Use  . Smoking status: Former Research scientist (life sciences)  .  Smokeless tobacco: Current User    Types: Chew  Substance and Sexual Activity  . Alcohol use: Never    Alcohol/week: 0.0 standard drinks    Frequency: Never  . Drug use: Never  . Sexual activity: Not on file  Lifestyle  . Physical activity:    Days per week: Not on file    Minutes per session: Not on file  . Stress: Not on file  Relationships  . Social connections:    Talks on phone: Not on file    Gets together: Not on file    Attends religious service: Not on file     Active member of club or organization: Not on file    Attends meetings of clubs or organizations: Not on file    Relationship status: Not on file  . Intimate partner violence:    Fear of current or ex partner: Not on file    Emotionally abused: Not on file    Physically abused: Not on file    Forced sexual activity: Not on file  Other Topics Concern  . Not on file  Social History Narrative  . Not on file      Review of Systems  Constitutional: Negative.  Negative for chills, fatigue and unexpected weight change.  HENT: Negative.  Negative for congestion, rhinorrhea, sneezing and sore throat.   Eyes: Negative for redness.  Respiratory: Negative.  Negative for cough, chest tightness and shortness of breath.   Cardiovascular: Negative.  Negative for chest pain and palpitations.  Gastrointestinal: Negative.  Negative for abdominal pain, constipation, diarrhea, nausea and vomiting.  Endocrine: Negative.   Genitourinary: Negative.  Negative for dysuria and frequency.  Musculoskeletal: Negative.  Negative for arthralgias, back pain, joint swelling and neck pain.  Skin: Negative.  Negative for rash.  Allergic/Immunologic: Negative.   Neurological: Negative.  Negative for tremors and numbness.  Hematological: Negative for adenopathy. Does not bruise/bleed easily.  Psychiatric/Behavioral: Negative.  Negative for behavioral problems, sleep disturbance and suicidal ideas. The patient is not nervous/anxious.     Vital Signs: BP 130/78   Pulse 75   Resp 16   Ht 5\' 11"  (1.803 m)   Wt 259 lb (117.5 kg)   SpO2 93%   BMI 36.12 kg/m    Physical Exam  Constitutional: He is oriented to person, place, and time. He appears well-developed and well-nourished. No distress.  HENT:  Head: Normocephalic and atraumatic.  Mouth/Throat: Oropharynx is clear and moist. No oropharyngeal exudate.  Eyes: Pupils are equal, round, and reactive to light. EOM are normal.  Neck: Normal range of motion. Neck  supple. No JVD present. No tracheal deviation present. No thyromegaly present.  Cardiovascular: Normal rate, regular rhythm and normal heart sounds. Exam reveals no gallop and no friction rub.  No murmur heard. Pulmonary/Chest: Effort normal and breath sounds normal. No respiratory distress. He has no wheezes. He has no rales. He exhibits no tenderness.  Abdominal: Soft. There is no tenderness. There is no guarding.  Musculoskeletal: Normal range of motion.  Lymphadenopathy:    He has no cervical adenopathy.  Neurological: He is alert and oriented to person, place, and time. No cranial nerve deficit.  Skin: Skin is warm and dry. He is not diaphoretic.  Psychiatric: He has a normal mood and affect. His behavior is normal. Judgment and thought content normal.  Nursing note and vitals reviewed.  Assessment/Plan: 1. Essential hypertension Stable, continue current therapy.   2. Dyslipidemia Continue Lipitor as ordered.    3.  Encounter for completion of form with patient Completed Lab [employer] form.  4. Obesity (BMI 30-39.9) Obesity Counseling: Risk Assessment: An assessment of behavioral risk factors was made today and includes lack of exercise sedentary lifestyle, lack of portion control and poor dietary habits.  Risk Modification Advice: She was counseled on portion control guidelines. Restricting daily caloric intake to. . The detrimental long term effects of obesity on her health and ongoing poor compliance was also discussed with the patient.  5. Tobacco dependence Smoking cessation counseling: 1. Pt acknowledges the risks of long term smoking, she will try to quite smoking. 2. Options for different medications including nicotine products, chewing gum, patch etc, Wellbutrin and Chantix is discussed 3. Goal and date of compete cessation is discussed 4. Total time spent in smoking cessation is 15 min.  6. Encounter for screening colonoscopy - Ambulatory referral to  Gastroenterology  General Counseling: Lee Douglas understanding of the findings of todays visit and agrees with plan of treatment. I have discussed any further diagnostic evaluation that may be needed or ordered today. We also reviewed his medications today. he has been encouraged to call the office with any questions or concerns that should arise related to todays visit.    Orders Placed This Encounter  Procedures  . Ambulatory referral to Gastroenterology    Time spent: 25 Minutes   This patient was seen by Orson Gear AGNP-C in Collaboration with Dr Lavera Guise as a part of collaborative care agreement    Dr Lavera Guise Internal medicine

## 2018-06-05 NOTE — Patient Instructions (Signed)

## 2018-09-04 ENCOUNTER — Ambulatory Visit: Payer: Self-pay | Admitting: Adult Health

## 2018-11-16 DIAGNOSIS — M25561 Pain in right knee: Secondary | ICD-10-CM | POA: Diagnosis not present

## 2020-03-30 ENCOUNTER — Encounter: Payer: Self-pay | Admitting: Nurse Practitioner

## 2020-03-30 ENCOUNTER — Ambulatory Visit: Payer: 59 | Admitting: Nurse Practitioner

## 2020-03-30 ENCOUNTER — Other Ambulatory Visit: Payer: Self-pay

## 2020-03-30 VITALS — BP 131/76 | HR 75 | Temp 97.4°F | Resp 16 | Ht 71.0 in | Wt 268.4 lb

## 2020-03-30 DIAGNOSIS — Z1211 Encounter for screening for malignant neoplasm of colon: Secondary | ICD-10-CM | POA: Diagnosis not present

## 2020-03-30 DIAGNOSIS — E785 Hyperlipidemia, unspecified: Secondary | ICD-10-CM

## 2020-03-30 DIAGNOSIS — I10 Essential (primary) hypertension: Secondary | ICD-10-CM | POA: Diagnosis not present

## 2020-03-30 DIAGNOSIS — K222 Esophageal obstruction: Secondary | ICD-10-CM

## 2020-03-30 DIAGNOSIS — R1310 Dysphagia, unspecified: Secondary | ICD-10-CM | POA: Diagnosis not present

## 2020-04-14 ENCOUNTER — Other Ambulatory Visit: Payer: 59

## 2020-04-17 ENCOUNTER — Other Ambulatory Visit
Admission: RE | Admit: 2020-04-17 | Discharge: 2020-04-17 | Disposition: A | Discharge status: Home / Self Care | Payer: 59 | Source: Ambulatory Visit | Attending: Gastroenterology | Admitting: Gastroenterology

## 2020-04-17 ENCOUNTER — Other Ambulatory Visit: Payer: Self-pay

## 2020-04-17 DIAGNOSIS — Z01812 Encounter for preprocedural laboratory examination: Secondary | ICD-10-CM | POA: Insufficient documentation

## 2020-04-17 DIAGNOSIS — Z20822 Contact with and (suspected) exposure to covid-19: Secondary | ICD-10-CM | POA: Diagnosis not present

## 2020-04-17 LAB — SARS CORONAVIRUS 2 (TAT 6-24 HRS): SARS Coronavirus 2: NEGATIVE

## 2020-04-18 ENCOUNTER — Ambulatory Visit: Payer: 59 | Admitting: Anesthesiology

## 2020-04-18 ENCOUNTER — Other Ambulatory Visit: Payer: Self-pay

## 2020-04-18 ENCOUNTER — Encounter: Payer: Self-pay | Admitting: *Deleted

## 2020-04-18 ENCOUNTER — Encounter
Admission: RE | Disposition: A | Discharge status: Home / Self Care | Payer: Self-pay | Source: Home / Self Care | Attending: Gastroenterology

## 2020-04-18 ENCOUNTER — Ambulatory Visit
Admission: RE | Admit: 2020-04-18 | Discharge: 2020-04-18 | Disposition: A | Discharge status: Home / Self Care | Payer: 59 | Attending: Gastroenterology | Admitting: Gastroenterology

## 2020-04-18 DIAGNOSIS — Z1211 Encounter for screening for malignant neoplasm of colon: Secondary | ICD-10-CM | POA: Diagnosis present

## 2020-04-18 DIAGNOSIS — Z7982 Long term (current) use of aspirin: Secondary | ICD-10-CM | POA: Insufficient documentation

## 2020-04-18 DIAGNOSIS — K317 Polyp of stomach and duodenum: Secondary | ICD-10-CM | POA: Diagnosis not present

## 2020-04-18 DIAGNOSIS — Z87891 Personal history of nicotine dependence: Secondary | ICD-10-CM | POA: Insufficient documentation

## 2020-04-18 DIAGNOSIS — K640 First degree hemorrhoids: Secondary | ICD-10-CM | POA: Insufficient documentation

## 2020-04-18 DIAGNOSIS — K5730 Diverticulosis of large intestine without perforation or abscess without bleeding: Secondary | ICD-10-CM | POA: Diagnosis not present

## 2020-04-18 DIAGNOSIS — R1310 Dysphagia, unspecified: Secondary | ICD-10-CM | POA: Insufficient documentation

## 2020-04-18 DIAGNOSIS — Z885 Allergy status to narcotic agent status: Secondary | ICD-10-CM | POA: Insufficient documentation

## 2020-04-18 DIAGNOSIS — I10 Essential (primary) hypertension: Secondary | ICD-10-CM | POA: Insufficient documentation

## 2020-04-18 DIAGNOSIS — D125 Benign neoplasm of sigmoid colon: Secondary | ICD-10-CM | POA: Diagnosis not present

## 2020-04-18 DIAGNOSIS — Z7902 Long term (current) use of antithrombotics/antiplatelets: Secondary | ICD-10-CM | POA: Insufficient documentation

## 2020-04-18 DIAGNOSIS — D128 Benign neoplasm of rectum: Secondary | ICD-10-CM | POA: Diagnosis not present

## 2020-04-18 DIAGNOSIS — Z79899 Other long term (current) drug therapy: Secondary | ICD-10-CM | POA: Diagnosis not present

## 2020-04-18 DIAGNOSIS — E785 Hyperlipidemia, unspecified: Secondary | ICD-10-CM | POA: Insufficient documentation

## 2020-04-18 HISTORY — PX: COLONOSCOPY WITH PROPOFOL: SHX5780

## 2020-04-18 HISTORY — PX: ESOPHAGOGASTRODUODENOSCOPY (EGD) WITH PROPOFOL: SHX5813

## 2020-04-18 SURGERY — ESOPHAGOGASTRODUODENOSCOPY (EGD) WITH PROPOFOL
Anesthesia: General

## 2020-04-18 MED ORDER — PROPOFOL 500 MG/50ML IV EMUL
INTRAVENOUS | Status: DC | PRN
  Administered 2020-04-18: 120 ug/kg/min via INTRAVENOUS

## 2020-04-18 MED ORDER — SODIUM CHLORIDE 0.9 % IV SOLN
INTRAVENOUS | Status: DC
  Administered 2020-04-18: 1000 mL via INTRAVENOUS

## 2020-04-18 MED ORDER — PROPOFOL 10 MG/ML IV BOLUS
INTRAVENOUS | Status: AC
  Filled 2020-04-18: qty 20

## 2020-04-18 MED ORDER — PROPOFOL 10 MG/ML IV BOLUS
INTRAVENOUS | Status: DC | PRN
  Administered 2020-04-18: 30 mg via INTRAVENOUS
  Administered 2020-04-18: 100 mg via INTRAVENOUS

## 2020-04-18 MED ORDER — LIDOCAINE HCL (CARDIAC) PF 100 MG/5ML IV SOSY
PREFILLED_SYRINGE | INTRAVENOUS | Status: DC | PRN
  Administered 2020-04-18: 60 mg via INTRAVENOUS

## 2020-04-18 NOTE — Op Note (Signed)
[facility] Gastroenterology Patient Name: Lee Douglas Procedure Date: 04/18/2020 12:50 PM MRN: [id number] Account #: [id number] Date of Birth: [birth year] Admit Type: Outpatient Age: 64 Room: [facility] ENDO ROOM 3 Gender: Male Note Status: Finalized Procedure:             Colonoscopy Indications:           Screening for colorectal malignant neoplasm Providers:             Andrey Farmer MD, MD Medicines:             Monitored Anesthesia Care Complications:         No immediate complications. Estimated blood loss:                         Minimal. Procedure:             Pre-Anesthesia Assessment:                        - Prior to the procedure, a History and Physical was                         performed, and patient medications and allergies were                         reviewed. The patient is competent. The risks and                         benefits of the procedure and the sedation options and                         risks were discussed with the patient. All questions                         were answered and informed consent was obtained.                         Patient identification and proposed procedure were                         verified by the physician, the nurse, the anesthetist                         and the technician in the pre-procedure area in the                         endoscopy suite. Mental Status Examination: alert and                         oriented. Airway Examination: normal oropharyngeal                         airway and neck mobility. Respiratory Examination:                         clear to auscultation. CV Examination: normal.                         Prophylactic Antibiotics: The patient does not require  prophylactic antibiotics. Prior Anticoagulants: The                         patient has taken Plavix (clopidogrel), last dose was                         5 days prior to procedure. ASA Grade Assessment: III -                          A patient with severe systemic disease. After                         reviewing the risks and benefits, the patient was                         deemed in satisfactory condition to undergo the                         procedure. The anesthesia plan was to use monitored                         anesthesia care (MAC). Immediately prior to                         administration of medications, the patient was                         re-assessed for adequacy to receive sedatives. The                         heart rate, respiratory rate, oxygen saturations,                         blood pressure, adequacy of pulmonary ventilation, and                         response to care were monitored throughout the                         procedure. The physical status of the patient was                         re-assessed after the procedure.                        After obtaining informed consent, the colonoscope was                         passed under direct vision. Throughout the procedure,                         the patient's blood pressure, pulse, and oxygen                         saturations were monitored continuously. The                         Colonoscope was introduced through the anus and  advanced to the the cecum, identified by appendiceal                         orifice and ileocecal valve. The colonoscopy was                         performed without difficulty. The patient tolerated                         the procedure well. The quality of the bowel                         preparation was good. Findings:      The perianal and digital rectal examinations were normal.      A few small-mouthed diverticula were found in the entire colon.      A 3 mm polyp was found in the descending colon. The polyp was sessile.       The polyp was removed with a cold snare. Resection and retrieval were       complete. Estimated blood loss was minimal.      A 2  mm polyp was found in the sigmoid colon. The polyp was sessile. The       polyp was removed with a jumbo cold forceps. Resection and retrieval       were complete. Estimated blood loss was minimal.      A 5 mm polyp was found in the rectum. The polyp was sessile. The polyp       was removed with a cold snare. Resection and retrieval were complete.       Estimated blood loss was minimal.      Non-bleeding internal hemorrhoids were found during retroflexion. The       hemorrhoids were Grade I (internal hemorrhoids that do not prolapse).      The exam was otherwise without abnormality on direct and retroflexion       views. Impression:            - Diverticulosis in the entire examined colon.                        - One 3 mm polyp in the descending colon, removed with                         a cold snare. Resected and retrieved.                        - One 2 mm polyp in the sigmoid colon, removed with a                         jumbo cold forceps. Resected and retrieved.                        - One 5 mm polyp in the rectum, removed with a cold                         snare. Resected and retrieved.                        - Non-bleeding internal hemorrhoids.                        -  The examination was otherwise normal on direct and                         retroflexion views. Recommendation:        - Discharge patient to home.                        - Resume previous diet.                        - Continue present medications.                        - Await pathology results.                        - Resume Plavix (clopidogrel) at prior dose tomorrow.                        - Repeat colonoscopy for surveillance based on                         pathology results.                        - Return to referring physician as previously                         scheduled. Procedure Code(s):     --- Professional ---                        [phone], Colonoscopy, flexible; with removal of                          tumor(s), polyp(s), or other lesion(s) by snare                         technique                        [id number], 34, Colonoscopy, flexible; with biopsy, single                         or multiple Diagnosis Code(s):     --- Professional ---                        Z12.11, Encounter for screening for malignant neoplasm                         of colon                        K64.0, First degree hemorrhoids                        K63.5, Polyp of colon                        K62.1, Rectal polyp                        K57.30, Diverticulosis of large intestine without  perforation or abscess without bleeding CPT copyright 2019 American Medical Association. All rights reserved. The codes documented in this report are preliminary and upon coder review may  be revised to meet current compliance requirements. Andrey Farmer, MD Andrey Farmer MD, MD 04/18/2020 1:38:41 PM Number of Addenda: 0 Note Initiated On: 04/18/2020 12:50 PM Scope Withdrawal Time: 0 hours 15 minutes 39 seconds  Total Procedure Duration: 0 hours 21 minutes 44 seconds  Estimated Blood Loss:  Estimated blood loss was minimal.      [facility]

## 2020-04-18 NOTE — Op Note (Signed)
[facility] Gastroenterology Patient Name: Lee Douglas Procedure Date: 04/18/2020 12:51 PM MRN: [id number] Account #: [id number] Date of Birth: [birth year] Admit Type: Outpatient Age: 64 Room: [facility] ENDO ROOM 3 Gender: Male Note Status: Finalized Procedure:             Upper GI endoscopy Indications:           Dysphagia Providers:             Andrey Farmer MD, MD Medicines:             Monitored Anesthesia Care Complications:         No immediate complications. Estimated blood loss:                         Minimal. Procedure:             Pre-Anesthesia Assessment:                        - Prior to the procedure, a History and Physical was                         performed, and patient medications and allergies were                         reviewed. The patient is competent. The risks and                         benefits of the procedure and the sedation options and                         risks were discussed with the patient. All questions                         were answered and informed consent was obtained.                         Patient identification and proposed procedure were                         verified by the physician, the nurse, the anesthetist                         and the technician in the endoscopy suite. Mental                         Status Examination: alert and oriented. Airway                         Examination: normal oropharyngeal airway and neck                         mobility. Respiratory Examination: clear to                         auscultation. CV Examination: normal. Prophylactic                         Antibiotics: The patient does not require prophylactic  antibiotics. Prior Anticoagulants: The patient has                         taken Plavix (clopidogrel), last dose was 5 days prior                         to procedure. ASA Grade Assessment: III - A patient                         with severe systemic  disease. After reviewing the                         risks and benefits, the patient was deemed in                         satisfactory condition to undergo the procedure. The                         anesthesia plan was to use monitored anesthesia care                         (MAC). Immediately prior to administration of                         medications, the patient was re-assessed for adequacy                         to receive sedatives. The heart rate, respiratory                         rate, oxygen saturations, blood pressure, adequacy of                         pulmonary ventilation, and response to care were                         monitored throughout the procedure. The physical                         status of the patient was re-assessed after the                         procedure.                        After obtaining informed consent, the endoscope was                         passed under direct vision. Throughout the procedure,                         the patient's blood pressure, pulse, and oxygen                         saturations were monitored continuously. The Endoscope                         was introduced through the mouth, and advanced to the  second part of duodenum. The upper GI endoscopy was                         accomplished without difficulty. The patient tolerated                         the procedure well. Findings:      The examined esophagus was normal. Biopsies were obtained from the       proximal and distal esophagus with cold forceps for histology of       suspected eosinophilic esophagitis. Estimated blood loss was minimal.      A single 1 mm sessile polyp with no stigmata of recent bleeding was       found in the cardia. The polyp was removed with a cold biopsy forceps.       Resection and retrieval were complete.      The examined duodenum was normal. Impression:            - Normal esophagus. Biopsied.                         - A single gastric polyp. Resected and retrieved.                        - Normal examined duodenum. Recommendation:        - Discharge patient to home.                        - Resume previous diet.                        - Continue present medications.                        - Await pathology results.                        - Resume Plavix (clopidogrel) at prior dose tomorrow.                        - Perform a colonoscopy today. Procedure Code(s):     --- Professional ---                        [phone], Esophagogastroduodenoscopy, flexible,                         transoral; with biopsy, single or multiple Diagnosis Code(s):     --- Professional ---                        K31.7, Polyp of stomach and duodenum                        R13.10, Dysphagia, unspecified CPT copyright 2019 American Medical Association. All rights reserved. The codes documented in this report are preliminary and upon coder review may  be revised to meet current compliance requirements. Andrey Farmer, MD Andrey Farmer MD, MD 04/18/2020 1:33:24 PM Number of Addenda: 0 Note Initiated On: 04/18/2020 12:51 PM Estimated Blood Loss:  Estimated blood loss was minimal.      [facility]

## 2020-04-18 NOTE — Anesthesia Preprocedure Evaluation (Signed)
Anesthesia Evaluation  Patient identified by MRN, date of birth, ID band Patient awake    Reviewed: Allergy & Precautions, NPO status , Patient's Chart, lab work & pertinent test results  History of Anesthesia Complications Negative for: history of anesthetic complications  Airway Mallampati: III  TM Distance: >3 FB Neck ROM: Full    Dental  (+) Upper Dentures, Poor Dentition, Missing   Pulmonary neg sleep apnea, neg COPD, former smoker,    breath sounds clear to auscultation- rhonchi (-) wheezing      Cardiovascular hypertension, Pt. on medications + CAD and + CABG  (-) Cardiac Stents  Rhythm:Regular Rate:Normal - Systolic murmurs and - Diastolic murmurs    Neuro/Psych neg Seizures negative neurological ROS  negative psych ROS   GI/Hepatic negative GI ROS, Neg liver ROS,   Endo/Other  negative endocrine ROSneg diabetes  Renal/GU negative Renal ROS     Musculoskeletal negative musculoskeletal ROS (+)   Abdominal (+) + obese,   Peds  Hematology negative hematology ROS (+)   Anesthesia Other Findings Past Medical History: No date: Hyperlipidemia No date: Hypertension   Reproductive/Obstetrics                             Anesthesia Physical Anesthesia Plan  ASA: III  Anesthesia Plan: General   Post-op Pain Management:    Induction: Intravenous  PONV Risk Score and Plan: 1 and Propofol infusion  Airway Management Planned: Natural Airway  Additional Equipment:   Intra-op Plan:   Post-operative Plan:   Informed Consent: I have reviewed the patients History and Physical, chart, labs and discussed the procedure including the risks, benefits and alternatives for the proposed anesthesia with the patient or authorized representative who has indicated his/her understanding and acceptance.     Dental advisory given  Plan Discussed with: CRNA and Anesthesiologist  Anesthesia  Plan Comments:         Anesthesia Quick Evaluation

## 2020-04-18 NOTE — Anesthesia Postprocedure Evaluation (Signed)
Anesthesia Post Note  Patient: Lee Douglas  Procedure(s) Performed: ESOPHAGOGASTRODUODENOSCOPY (EGD) WITH PROPOFOL (N/A ) COLONOSCOPY WITH PROPOFOL (N/A )  Patient location during evaluation: Endoscopy Anesthesia Type: General Level of consciousness: awake and alert and oriented Pain management: pain level controlled Vital Signs Assessment: post-procedure vital signs reviewed and stable Respiratory status: spontaneous breathing, nonlabored ventilation and respiratory function stable Cardiovascular status: blood pressure returned to baseline and stable Postop Assessment: no signs of nausea or vomiting Anesthetic complications: no   No complications documented.   Last Vitals:  Vitals:   04/18/20 1353 04/18/20 1358  BP: 135/75 (!) 149/84  Pulse: (!) 57 (!) 53  Resp: 14 13  Temp:    SpO2: 100% 100%    Last Pain:  Vitals:   04/18/20 1358  TempSrc:   PainSc: 0-No pain                 Chanoch Mccleery

## 2020-04-18 NOTE — H&P (Signed)
Outpatient short stay form Pre-procedure 04/18/2020 12:39 PM Raylene Miyamoto MD, MPH  Primary Physician: NP Junius Creamer  Reason for visit:  Dysphagia/Screening  History of present illness:   64 y/o gentleman with history of dysphagia here for EGD and screening colonoscopy. Last took plavix 5 days ago. No abdominal surgeries. No family history of GI malignancies.    Current Facility-Administered Medications:  .  0.9 %  sodium chloride infusion, , Intravenous, Continuous, Jhace Fennell, Hilton Cork, MD, Last Rate: 20 mL/hr at 04/18/20 1238, 1,000 mL at 04/18/20 1238  Medications Prior to Admission  Medication Sig Dispense Refill Last Dose  . aspirin 81 MG tablet Take by mouth.   Past Week at Unknown time  . atorvastatin (LIPITOR) 80 MG tablet    04/17/2020 at Unknown time  . clopidogrel (PLAVIX) 75 MG tablet    Past Week at Unknown time  . lisinopril (PRINIVIL,ZESTRIL) 10 MG tablet    04/18/2020 at 0600Unknown time  . Multiple Vitamins-Minerals (MULTIVITAMIN ADULT PO) Take by mouth.   04/17/2020 at Unknown time  . Omega-3 1000 MG CAPS Take by mouth.   04/17/2020 at Unknown time  . nitroGLYCERIN (NITROSTAT) 0.4 MG SL tablet  (Patient not taking: Reported on 04/18/2020)   Not Taking at Unknown time     Allergies  Allergen Reactions  . Codeine Nausea Only     Past Medical History:  Diagnosis Date  . Hyperlipidemia   . Hypertension     Review of systems:  Otherwise negative.    Physical Exam  Gen: Alert, oriented. Appears stated age.  HEENT: [place]/AT. PERRLA. Lungs: No respiratory distress Abd: soft, benign, no masses. BS+ Ext: No edema.    Planned procedures: Proceed with EGD/colonoscopy. The patient understands the nature of the planned procedure, indications, risks, alternatives and potential complications including but not limited to bleeding, infection, perforation, damage to internal organs and possible oversedation/side effects from anesthesia. The patient agrees and gives consent to  proceed.  Please refer to procedure notes for findings, recommendations and patient disposition/instructions.     Raylene Miyamoto MD, MPH Gastroenterology 04/18/2020  12:39 PM

## 2020-04-18 NOTE — Interval H&P Note (Signed)
History and Physical Interval Note:  04/18/2020 12:41 PM  Lee Douglas  has presented today for surgery, with the diagnosis of DYSPHAGIA SCREENING.  The various methods of treatment have been discussed with the patient and family. After consideration of risks, benefits and other options for treatment, the patient has consented to  Procedure(s): ESOPHAGOGASTRODUODENOSCOPY (EGD) WITH PROPOFOL (N/A) COLONOSCOPY WITH PROPOFOL (N/A) as a surgical intervention.  The patient's history has been reviewed, patient examined, no change in status, stable for surgery.  I have reviewed the patient's chart and labs.  Questions were answered to the patient's satisfaction.     Lesly Rubenstein  Ok to proceed with EGD/Colonoscopy

## 2020-04-18 NOTE — Transfer of Care (Signed)
Immediate Anesthesia Transfer of Care Note  Patient: Lee Douglas  Procedure(s) Performed: ESOPHAGOGASTRODUODENOSCOPY (EGD) WITH PROPOFOL (N/A ) COLONOSCOPY WITH PROPOFOL (N/A )  Patient Location: Endoscopy Unit  Anesthesia Type:General  Level of Consciousness: drowsy and patient cooperative  Airway & Oxygen Therapy: Patient Spontanous Breathing  Post-op Assessment: Report given to RN and Post -op Vital signs reviewed and stable  Post vital signs: Reviewed and stable  Last Vitals:  Vitals Value Taken Time  BP 90/50 04/18/20 1333  Temp 36.1 C 04/18/20 1333  Pulse 61 04/18/20 1333  Resp 17 04/18/20 1333  SpO2 96 % 04/18/20 1333    Last Pain:  Vitals:   04/18/20 1333  TempSrc:   PainSc: 0-No pain         Complications: No complications documented.

## 2020-04-20 ENCOUNTER — Encounter: Payer: Self-pay | Admitting: Gastroenterology

## 2020-04-20 LAB — SURGICAL PATHOLOGY

## 2020-04-21 NOTE — Progress Notes (Signed)
[facility] [place], [place] [address]  Internal MEDICINE  Office Visit Note  Patient Name: Lee Douglas  [id number]  [id number]  Date of Service: 04/26/2020  Chief Complaint  Patient presents with  . Follow-up  . Hypertension  . Hyperlipidemia    The patient presents to the office for follow up visit. He states that he has been having trouble swallowing. He has known history of esophageal strictures. He has had to have two endoscopies in the past to stretch the esophagus. Both times he had food bolus which was lodged in the esophagus. He states that he has not had a colonoscopy. He needs to have a new referral to GI provider as he is overdue to have colonoscopy and recently started having intermittent problems swallowing and feelings of food getting stuck in his throat. He also has history of MI in 2004. He has seen cardiology at [facility] every year since then. Condition is stable.       Current Medication: Outpatient Encounter Medications as of 03/30/2020  Medication Sig Note  . aspirin 81 MG tablet Take by mouth. 02/20/2016: Received from:  [facility]  . atorvastatin (LIPITOR) 80 MG tablet  02/20/2016: Received from: External Pharmacy  . clopidogrel (PLAVIX) 75 MG tablet  02/20/2016: Received from: External Pharmacy  . lisinopril (PRINIVIL,ZESTRIL) 10 MG tablet  02/20/2016: Received from: External Pharmacy  . Multiple Vitamins-Minerals (MULTIVITAMIN ADULT PO) Take by mouth. 02/20/2016: Received from: [facility]  . nitroGLYCERIN (NITROSTAT) 0.4 MG SL tablet  (Patient not taking: Reported on 04/18/2020) 02/20/2016: Received from: External Pharmacy  . Omega-3 1000 MG CAPS Take by mouth. 02/20/2016: Received from: [facility]   No facility-administered encounter medications on file as of 03/30/2020.    Surgical History: Past Surgical History:  Procedure Laterality Date  . COLONOSCOPY WITH PROPOFOL N/A 04/18/2020    Procedure: COLONOSCOPY WITH PROPOFOL;  Surgeon: Lesly Rubenstein, MD;  Location: [facility] ENDOSCOPY;  Service: Endoscopy;  Laterality: N/A;  . CORONARY ANGIOPLASTY    . ESOPHAGOGASTRODUODENOSCOPY (EGD) WITH PROPOFOL N/A 04/18/2020   Procedure: ESOPHAGOGASTRODUODENOSCOPY (EGD) WITH PROPOFOL;  Surgeon: Lesly Rubenstein, MD;  Location: [facility] ENDOSCOPY;  Service: Endoscopy;  Laterality: N/A;  . EXPLORATION POST OPERATIVE OPEN HEART    . HERNIA REPAIR    . SINUS SURGERY WITH INSTATRAK      Medical History: Past Medical History:  Diagnosis Date  . Hyperlipidemia   . Hypertension     Family History: Family History  Problem Relation Age of Onset  . Stroke Father     Social History   Socioeconomic History  . Marital status: Married    Spouse name: Not on file  . Number of children: Not on file  . Years of education: Not on file  . Highest education level: Not on file  Occupational History  . Not on file  Tobacco Use  . Smoking status: Former Research scientist (life sciences)  . Smokeless tobacco: Current User    Types: Chew  Vaping Use  . Vaping Use: Never used  Substance and Sexual Activity  . Alcohol use: Never    Alcohol/week: 0.0 standard drinks  . Drug use: Never  . Sexual activity: Not on file  Other Topics Concern  . Not on file  Social History Narrative  . Not on file   Social Determinants of Health   Financial Resource Strain:   . Difficulty of Paying Living Expenses:   Food Insecurity:   . Worried About  Running Out of Food in the Last Year:   . [place] in the Last Year:   Transportation Needs:   . Lack of Transportation (Medical):   [place] Lack of Transportation (Non-Medical):   Physical Activity:   . Days of Exercise per Week:   . Minutes of Exercise per Session:   Stress:   . Feeling of Stress :   Social Connections:   . Frequency of Communication with Friends and Family:   . Frequency of Social Gatherings with Friends and Family:   . Attends Religious Services:   .  Active Member of Clubs or Organizations:   . Attends Archivist Meetings:   [place] Marital Status:   Intimate Partner Violence:   . Fear of Current or Ex-Partner:   . Emotionally Abused:   [place] Physically Abused:   . Sexually Abused:       Review of Systems  Constitutional: Negative for activity change, chills, fatigue and unexpected weight change.  HENT: Positive for trouble swallowing. Negative for congestion, postnasal drip, rhinorrhea, sneezing and sore throat.   Respiratory: Negative for cough, chest tightness, shortness of breath and wheezing.   Cardiovascular: Negative for chest pain and palpitations.  Gastrointestinal: Negative for abdominal pain, constipation, diarrhea, nausea and vomiting.  Endocrine: Negative for cold intolerance, heat intolerance, polydipsia and polyuria.  Musculoskeletal: Negative for arthralgias, back pain, joint swelling and neck pain.  Skin: Negative for rash.  Allergic/Immunologic: Negative for environmental allergies.  Neurological: Negative for dizziness, tremors, numbness and headaches.  Hematological: Negative for adenopathy. Does not bruise/bleed easily.  Psychiatric/Behavioral: Negative for behavioral problems (Depression), sleep disturbance and suicidal ideas. The patient is not nervous/anxious.     Today's Vitals   03/30/20 1457  BP: (!) 131/76  Pulse: 75  Resp: 16  Temp: (!) 97.4 F (36.3 C)  SpO2: 95%  Weight: (!) 268 lb 6.4 oz (121.7 kg)  Height: 5\' 11"  (1.803 m)   Body mass index is 37.43 kg/m.  Physical Exam Vitals and nursing note reviewed.  Constitutional:      General: He is not in acute distress.    Appearance: Normal appearance. He is well-developed. He is not diaphoretic.  HENT:     Head: Normocephalic and atraumatic.     Nose: Nose normal.     Mouth/Throat:     Pharynx: No oropharyngeal exudate.  Eyes:     Pupils: Pupils are equal, round, and reactive to light.  Neck:     Thyroid: No thyromegaly.      Vascular: No JVD.     Trachea: No tracheal deviation.  Cardiovascular:     Rate and Rhythm: Normal rate and regular rhythm.     Heart sounds: Normal heart sounds. No murmur heard.  No friction rub. No gallop.   Pulmonary:     Effort: Pulmonary effort is normal. No respiratory distress.     Breath sounds: Normal breath sounds. No wheezing or rales.  Chest:     Chest wall: No tenderness.  Abdominal:     General: Bowel sounds are normal.     Palpations: Abdomen is soft.     Tenderness: There is no abdominal tenderness.  Musculoskeletal:        General: Normal range of motion.     Cervical back: Normal range of motion and neck supple.  Lymphadenopathy:     Cervical: No cervical adenopathy.  Skin:    General: Skin is warm and dry.  Neurological:     Mental  Status: He is alert and oriented to person, place, and time.     Cranial Nerves: No cranial nerve deficit.  Psychiatric:        Mood and Affect: Mood normal.        Behavior: Behavior normal.        Thought Content: Thought content normal.        Judgment: Judgment normal.    Assessment/Plan: 1. Dysphagia, unspecified type Patient with history of esophageal strictures. Needs to have new referral to GI for further evaluation and treatment.  - Ambulatory referral to Gastroenterology  2. Esophageal stricture Patient with history of esophageal strictures. Needs to have new referral to GI for further evaluation and treatment.  - Ambulatory referral to Gastroenterology  3. Screening for colon cancer Refer to GI - Ambulatory referral to Gastroenterology  4. Essential hypertension Stable. Continue bp medication as prescribed.   5. Dyslipidemia Continue statin as prescribed   General Counseling: Claron verbalizes understanding of the findings of todays visit and agrees with plan of treatment. I have discussed any further diagnostic evaluation that may be needed or ordered today. We also reviewed his medications today. he has been  encouraged to call the office with any questions or concerns that should arise related to todays visit.  This patient was seen by Leretha Pol FNP Collaboration with Dr Lavera Guise as a part of collaborative care agreement  Orders Placed This Encounter  Procedures  . Ambulatory referral to Gastroenterology      Total time spent: 30 Minutes   Time spent includes review of chart, medications, test results, and follow up plan with the patient.      Dr Lavera Guise Internal medicine

## 2020-04-26 DIAGNOSIS — Z1211 Encounter for screening for malignant neoplasm of colon: Secondary | ICD-10-CM | POA: Insufficient documentation

## 2020-04-26 DIAGNOSIS — K222 Esophageal obstruction: Secondary | ICD-10-CM | POA: Insufficient documentation

## 2020-04-26 DIAGNOSIS — R1310 Dysphagia, unspecified: Secondary | ICD-10-CM | POA: Insufficient documentation

## 2020-06-15 ENCOUNTER — Encounter: Payer: Self-pay | Admitting: Nurse Practitioner

## 2020-06-15 ENCOUNTER — Other Ambulatory Visit: Payer: Self-pay

## 2020-06-15 ENCOUNTER — Ambulatory Visit: Payer: 59 | Admitting: Nurse Practitioner

## 2020-06-15 ENCOUNTER — Other Ambulatory Visit
Admission: RE | Admit: 2020-06-15 | Discharge: 2020-06-15 | Disposition: A | Discharge status: Home / Self Care | Payer: 59 | Source: Ambulatory Visit | Attending: Gastroenterology | Admitting: Gastroenterology

## 2020-06-15 VITALS — BP 148/73 | HR 78 | Temp 97.5°F | Resp 16 | Ht 71.0 in | Wt 267.0 lb

## 2020-06-15 DIAGNOSIS — I2510 Atherosclerotic heart disease of native coronary artery without angina pectoris: Secondary | ICD-10-CM

## 2020-06-15 DIAGNOSIS — Z0001 Encounter for general adult medical examination with abnormal findings: Secondary | ICD-10-CM | POA: Diagnosis not present

## 2020-06-15 DIAGNOSIS — Z6837 Body mass index (BMI) 37.0-37.9, adult: Secondary | ICD-10-CM

## 2020-06-15 DIAGNOSIS — Z01818 Encounter for other preprocedural examination: Secondary | ICD-10-CM | POA: Diagnosis present

## 2020-06-15 DIAGNOSIS — E785 Hyperlipidemia, unspecified: Secondary | ICD-10-CM | POA: Diagnosis not present

## 2020-06-15 DIAGNOSIS — I10 Essential (primary) hypertension: Secondary | ICD-10-CM

## 2020-06-15 DIAGNOSIS — Z23 Encounter for immunization: Secondary | ICD-10-CM | POA: Diagnosis not present

## 2020-06-15 DIAGNOSIS — K222 Esophageal obstruction: Secondary | ICD-10-CM

## 2020-06-15 DIAGNOSIS — R300 Dysuria: Secondary | ICD-10-CM

## 2020-06-15 DIAGNOSIS — Z20822 Contact with and (suspected) exposure to covid-19: Secondary | ICD-10-CM | POA: Insufficient documentation

## 2020-06-15 LAB — SARS CORONAVIRUS 2 (TAT 6-24 HRS): SARS Coronavirus 2: NEGATIVE

## 2020-06-15 NOTE — Progress Notes (Signed)
[facility] [place], [place] [address]  Internal MEDICINE  Office Visit Note  Patient Name: Lee Douglas  [id number]  [id number]  Date of Service: 07/02/2020   Pt is here for routine health maintenance examination   Chief Complaint  Patient presents with  . Annual Exam    bio screening results  . Hyperlipidemia  . Hypertension  . Quality Metric Gaps    flu, tetnaus, covid, hep C  . controlled substance form    reviewed with PT     The patient is here for health maintenance exam. Does need to have biometric exemption form filled out and returned to wife's place of employment.  His BMI is 37.24 and expected BMI is <29.9. we have discussed limiting calorie intake to 1500 calories per day and incorporating exercise into his daily routine in order to help him lose weight in healthy manner. He had labs done through the biometric screen done by his employer and will bring a copy of results to put in his chart. Blood pressure is mildly elevated today. He denies chest pain, chest pressure, and shortness of breath, or headache. He does see a cardiologist routinely.   Current Medication: Outpatient Encounter Medications as of 06/15/2020  Medication Sig Note  . aspirin 81 MG tablet Take by mouth. 02/20/2016: Received from: [place]  . atorvastatin (LIPITOR) 80 MG tablet  02/20/2016: Received from: External Pharmacy  . clopidogrel (PLAVIX) 75 MG tablet  02/20/2016: Received from: External Pharmacy  . lisinopril (PRINIVIL,ZESTRIL) 10 MG tablet  02/20/2016: Received from: External Pharmacy  . Multiple Vitamins-Minerals (MULTIVITAMIN ADULT PO) Take by mouth. 02/20/2016: Received from: [place]  . nitroGLYCERIN (NITROSTAT) 0.4 MG SL tablet  02/20/2016: Received from: External Pharmacy  . Omega-3 1000 MG CAPS Take by mouth. (Patient not taking: Reported on 06/19/2020) 02/20/2016: Received from: [facility]  .  pantoprazole (PROTONIX) 20 MG tablet Take 20 mg by mouth 2 (two) times daily.    No facility-administered encounter medications on file as of 06/15/2020.    Surgical History: Past Surgical History:  Procedure Laterality Date  . COLONOSCOPY WITH PROPOFOL N/A 04/18/2020   Procedure: COLONOSCOPY WITH PROPOFOL;  Surgeon: Lesly Rubenstein, MD;  Location: [facility] ENDOSCOPY;  Service: Endoscopy;  Laterality: N/A;  . CORONARY ANGIOPLASTY    . ESOPHAGOGASTRODUODENOSCOPY N/A 06/19/2020   Procedure: ESOPHAGOGASTRODUODENOSCOPY (EGD);  Surgeon: Lesly Rubenstein, MD;  Location: [facility] ENDOSCOPY;  Service: Endoscopy;  Laterality: N/A;  . ESOPHAGOGASTRODUODENOSCOPY (EGD) WITH PROPOFOL N/A 04/18/2020   Procedure: ESOPHAGOGASTRODUODENOSCOPY (EGD) WITH PROPOFOL;  Surgeon: Lesly Rubenstein, MD;  Location: [facility] ENDOSCOPY;  Service: Endoscopy;  Laterality: N/A;  . EXPLORATION POST OPERATIVE OPEN HEART    . HERNIA REPAIR    . SINUS SURGERY WITH INSTATRAK      Medical History: Past Medical History:  Diagnosis Date  . Hyperlipidemia   . Hypertension     Family History: Family History  Problem Relation Age of Onset  . Stroke Father       Review of Systems  Constitutional: Negative for activity change, chills, fatigue and unexpected weight change.  HENT: Negative for congestion, postnasal drip, rhinorrhea, sneezing and sore throat.   Respiratory: Negative for cough, chest tightness and shortness of breath.   Cardiovascular: Negative for chest pain and palpitations.       Mildly elevated blood pressure today. Routinely sees cardiology.   Gastrointestinal: Negative for abdominal pain, constipation, diarrhea, nausea and vomiting.  Chronic GERD. Does see GI provider.   Endocrine: Negative for cold intolerance, heat intolerance, polydipsia and polyuria.  Genitourinary: Negative for dysuria, frequency, hematuria and urgency.  Musculoskeletal: Negative for arthralgias, back pain, joint swelling and  neck pain.  Skin: Negative for rash.  Allergic/Immunologic: Negative for environmental allergies.  Neurological: Negative for dizziness, tremors, numbness and headaches.  Hematological: Negative for adenopathy. Does not bruise/bleed easily.  Psychiatric/Behavioral: Negative for behavioral problems (Depression), sleep disturbance and suicidal ideas. The patient is not nervous/anxious.      Today's Vitals   06/15/20 1446  BP: (!) 148/73  Pulse: 78  Resp: 16  Temp: (!) 97.5 F (36.4 C)  SpO2: 95%  Weight: 267 lb (121.1 kg)  Height: 5\' 11"  (1.803 m)   Body mass index is 37.24 kg/m.  Physical Exam Vitals and nursing note reviewed.  Constitutional:      General: He is not in acute distress.    Appearance: Normal appearance. He is well-developed. He is obese. He is not diaphoretic.  HENT:     Head: Normocephalic and atraumatic.     Nose: Nose normal.     Mouth/Throat:     Pharynx: No oropharyngeal exudate.  Eyes:     Pupils: Pupils are equal, round, and reactive to light.  Neck:     Thyroid: No thyromegaly.     Vascular: No JVD.     Trachea: No tracheal deviation.  Cardiovascular:     Rate and Rhythm: Normal rate and regular rhythm.     Pulses: Normal pulses.     Heart sounds: Normal heart sounds. No murmur heard.  No friction rub. No gallop.   Pulmonary:     Effort: Pulmonary effort is normal. No respiratory distress.     Breath sounds: Normal breath sounds. No wheezing or rales.  Chest:     Chest wall: No tenderness.  Abdominal:     General: Bowel sounds are normal.     Palpations: Abdomen is soft.     Tenderness: There is no abdominal tenderness.  Musculoskeletal:        General: Normal range of motion.     Cervical back: Normal range of motion and neck supple.  Lymphadenopathy:     Cervical: No cervical adenopathy.  Skin:    General: Skin is warm and dry.     Capillary Refill: Capillary refill takes less than 2 seconds.  Neurological:     General: No focal  deficit present.     Mental Status: He is alert and oriented to person, place, and time.     Cranial Nerves: No cranial nerve deficit.  Psychiatric:        Mood and Affect: Mood normal.        Behavior: Behavior normal.        Thought Content: Thought content normal.        Judgment: Judgment normal.      LABS: Recent Results (from the past 2160 hour(s))  SARS CORONAVIRUS 2 (TAT 6-24 HRS) Nasopharyngeal Nasopharyngeal Swab     Status: None   Collection Time: 04/17/20 10:22 AM   Specimen: Nasopharyngeal Swab  Result Value Ref Range   SARS Coronavirus 2 NEGATIVE NEGATIVE    Comment: (NOTE) SARS-CoV-2 target nucleic acids are NOT DETECTED.  The SARS-CoV-2 RNA is generally detectable in upper and lower respiratory specimens during the acute phase of infection. Negative results do not preclude SARS-CoV-2 infection, do not rule out co-infections with other pathogens, and should not be used as  the sole basis for treatment or other patient management decisions. Negative results must be combined with clinical observations, patient history, and epidemiological information. The expected result is Negative.  Fact Sheet for Patients: SugarRoll.be  Fact Sheet for Healthcare Providers: https://www.woods-mathews.com/  This test is not yet approved or cleared by the [place] FDA and  has been authorized for detection and/or diagnosis of SARS-CoV-2 by FDA under an Emergency Use Authorization (EUA). This EUA will remain  in effect (meaning this test can be used) for the duration of the COVID-19 declaration under Se ction 564(b)(1) of the Act, 21 U.S.C. section 360bbb-3(b)(1), unless the authorization is terminated or revoked sooner.  Performed at [facility], [place] [address] [place], [place] [address]   Surgical pathology     Status: None   Collection Time: 04/18/20  1:01 PM  Result Value Ref Range   SURGICAL PATHOLOGY      SURGICAL  PATHOLOGY CASE: [id number] PATIENT: Pia Mau Surgical Pathology Report     Specimen Submitted: A. Stomach polyp; cbx B. Esophagus, distal; cbx C. Esophagus, proximal; cbx D. Colon polyp, descending; cold snare E. Colon polyp, sigmoid; cbx F. Rectum polyp; cold snare  Clinical History: Dysphagia, screening.  Gastric polyp, colon polyps, diverticulosis, hemorrhoids    DIAGNOSIS: A. GASTRIC POLYP; COLD BIOPSY: - BENIGN GASTRIC OXYNTIC MUCOSA WITH POLYPOID FOVEOLAR HYPERPLASIA. - NEGATIVE FOR H. PYLORI, DYSPLASIA, AND MALIGNANCY.  B. ESOPHAGUS, DISTAL; COLD BIOPSY: - BENIGN SQUAMOUS MUCOSA WITH ACANTHOSIS AND SPONGIOSIS. - INCREASED INTRAEPITHELIAL EOSINOPHILS (UP TO 30 PER HPF). - NEGATIVE FOR DYSPLASIA AND MALIGNANCY.  C. ESOPHAGUS, PROXIMAL; COLD BIOPSY: - BENIGN SQUAMOUS MUCOSA WITH ACANTHOSIS AND SPONGIOSIS. - INCREASED INTRAEPITHELIAL EOSINOPHILS (UP TO 45 PER HPF). - NEGATIVE FOR DYSPLASIA AND MALIGNANCY.  D. COLON POLYP, DESCENDING;  COLD SNARE: - BENIGN COLONIC MUCOSA WITH SUPERFICIAL REACTIVE CHANGES. - NEGATIVE FOR DYSPLASIA AND MALIGNANCY.  Comment: Multiple additional deeper recut levels were examined.  E. COLON POLYP, SIGMOID; COLD BIOPSY: - TUBULAR ADENOMA. - NEGATIVE FOR HIGH-GRADE DYSPLASIA AND MALIGNANCY.  F. RECTAL POLYP; COLD SNARE: - TUBULOVILLOUS ADENOMA. - NEGATIVE FOR HIGH-GRADE DYSPLASIA AND MALIGNANCY.  Comment: Eosinophilic esophagitis (EoE) is a chronic, immune/antigen mediated esophageal disease characterized clinically by symptoms related to esophageal dysfunction and histologically by eosinophil predominant inflammation, defined as more than or equal to 15 eos/hpf (approx 60 eos/mm2) in the vast majority of cases. Based on recent consensus guidelines published in 2018 proton pump inhibitor (PPI) trial prior to treatment is no longer required. The new standard recognizes PPI therapy as a treatment for EoE rather than a  diagnostic criterion. The updated EoE diagnostic c riteria include: 1. Symptoms of esophageal dysfunction     - Concomitant atopic conditions should increase suspicion for EoE     - Endoscopic findings of rings, furrows exudates, edema, stricture, narrowing and crepe paper mucosa should increase suspicion for EoE (whereas previously, endoscopic findings was not an indication to biopsy) 2. =15 eos/hpf (60 eos/mm2) on esophageal biopsy     - Eosinophilic infiltration should be isolated to the esophagus 3. Assessment of non-EoE disorders that cause or potentially contribute to esophageal eosinophilia  Reference: Gastroenterology. 2018 Oct;155(4):1022-1033.e10  GROSS DESCRIPTION: A. Labeled: Gastric polyp cbx Received: Formalin Tissue fragment(s): 2 Size: Range from 0.3-0.4 cm Description: Tan soft tissue fragments Entirely submitted in 1 cassette.  B. Labeled: Distal esophagus cbxs, rule out EOE Received: Formalin Tissue fragment(s): Multiple Size: Aggregate, 1.5 x 0.5 x 0.2 cm Description: Tan soft tissue fragme nts Entirely submitted  in 1 cassette.  C. Labeled: Proximal esophagus cbxs, rule out EOE Received: Formalin Tissue fragment(s): Multiple Size: Aggregate, 1.1 x 0.8 x 0.2 cm Description: Tan soft tissue fragments Entirely submitted in 1 cassette.  D. Labeled: Descending colon polyp cold snare Received: Formalin Tissue fragment(s): 1 Size: 0.8 cm Description: Tan soft tissue fragment Entirely submitted in 1 cassette.  E. Labeled: Sigmoid colon polyp cbx Received: Formalin Tissue fragment(s): Multiple Size: Aggregate, 0.8 x 0.4 x 0.3 cm Description: Tan soft tissue fragment with admixed fecal matter Entirely submitted in 1 cassette.  F. Labeled: Rectum polyp cold snare Received: Formalin Tissue fragment(s): Multiple Size: Aggregate, 2.2 x 1.2 x 0.3 cm Description: Tan soft tissue fragments with admixed fecal matter Entirely submitted in 1  cassette.    Final Diagnosis performed by Allena Napoleon, MD.   Electronically signed 04/20/2020 4:11:23PM The electronic  signature indicates that the named Attending Pathologist has evaluated the specimen Technical component performed at [facility], [address], [place], [place] [address] Lab: [phone] Dir: Rush Farmer, MD, MMM  Professional component performed at [facility], [facility], [place], [place], [place] [address] Lab: [phone] Dir: Dellia Nims. Rubinas, MD   SARS CORONAVIRUS 2 (TAT 6-24 HRS) Nasopharyngeal Nasopharyngeal Swab     Status: None   Collection Time: 06/15/20 12:47 PM   Specimen: Nasopharyngeal Swab  Result Value Ref Range   SARS Coronavirus 2 NEGATIVE NEGATIVE    Comment: (NOTE) SARS-CoV-2 target nucleic acids are NOT DETECTED.  The SARS-CoV-2 RNA is generally detectable in upper and lower respiratory specimens during the acute phase of infection. Negative results do not preclude SARS-CoV-2 infection, do not rule out co-infections with other pathogens, and should not be used as the sole basis for treatment or other patient management decisions. Negative results must be combined with clinical observations, patient history, and epidemiological information. The expected result is Negative.  Fact Sheet for Patients: SugarRoll.be  Fact Sheet for Healthcare Providers: https://www.woods-mathews.com/  This test is not yet approved or cleared by the [place] FDA and  has been authorized for detection and/or diagnosis of SARS-CoV-2 by FDA under an Emergency Use Authorization (EUA). This EUA will remain  in effect (meaning this test can be used) for the duration of the COVID-19 declaration under Se ction 564(b)(1) of the Act, 21 U.S.C. section 360bbb-3(b)(1), unless the authorization is terminated or revoked sooner.  Performed at [facility], [place] [address], [place],  [place] [address]   UA/M w/rflx Culture, Routine     Status: None   Collection Time: 06/15/20  4:26 PM   Specimen: Urine   Urine  Result Value Ref Range   Specific Gravity, UA 1.012 1.005 - 1.030   pH, UA 5.0 5.0 - 7.5   Color, UA Yellow Yellow   Appearance Ur Clear Clear   Leukocytes,UA Negative Negative   Protein,UA Negative Negative/Trace   Glucose, UA Negative Negative   Ketones, UA Negative Negative   RBC, UA Negative Negative   Bilirubin, UA Negative Negative   Urobilinogen, Ur 0.2 0.2 - 1.0 mg/dL   Nitrite, UA Negative Negative   Microscopic Examination Comment     Comment: Microscopic follows if indicated.   Microscopic Examination See below:     Comment: Microscopic was indicated and was performed.   Urinalysis Reflex Comment     Comment: This specimen will not reflex to a Urine Culture.  Microscopic Examination     Status: None   Collection Time: 06/15/20  4:26 PM  Urine  Result Value Ref Range   WBC, UA None seen 0 - 5 /hpf   RBC None seen 0 - 2 /hpf   Epithelial Cells (non renal) None seen 0 - 10 /hpf   Casts None seen None seen /lpf   Bacteria, UA None seen None seen/Few  Surgical pathology     Status: None   Collection Time: 06/19/20  8:12 AM  Result Value Ref Range   SURGICAL PATHOLOGY      SURGICAL PATHOLOGY CASE: [id number] PATIENT: Pia Mau Surgical Pathology Report     Specimen Submitted: A. Esophagus, distal; cbx B. Esophagus, proximal; cbx  Clinical History: Esophagitis.    DIAGNOSIS: A. ESOPHAGUS, DISTAL; COLD BIOPSY: - BENIGN SQUAMOUS MUCOSA WITH ACANTHOSIS, SPONGIOSIS, AND INCREASED INTRAEPITHELIAL EOSINOPHILS (UP TO 35 PER HPF). - NEGATIVE FOR DYSPLASIA AND MALIGNANCY.  B. ESOPHAGUS, PROXIMAL; COLD BIOPSY: - BENIGN SQUAMOUS MUCOSA WITH ACANTHOSIS, SPONGIOSIS, ACTIVE INFLAMMATION, AND MILDLY INCREASED EOSINOPHILS (UP TO 10 PER HPF). - NEGATIVE FOR DYSPLASIA AND MALIGNANCY.  Comment: Due to the presence of focal active  inflammation, a GMS special stain was performed, and is negative for fungal organisms.  GROSS DESCRIPTION: A. Labeled: Cold biopsy distal esophagus for EOE Received: In formalin Tissue fragment(s): 4 Size: 1 x 0.2 x 0.1 cm Description: Aggregate of pink-white tissue fragments Entirely submitted in 1 cassette .  B. Labeled: Cold biopsy proximal esophagus for EOE Received: In formalin Tissue fragment(s): Multiple Size: 0.6 x 0.5 x 0.1 cm Description: Aggregate of pink-white tissue fragments Entirely submitted in 1 cassette.    Final Diagnosis performed by Allena Napoleon, MD.   Electronically signed 06/21/2020 9:08:32AM The electronic signature indicates that the named Attending Pathologist has evaluated the specimen Technical component performed at [facility], [address] [place], [place] [address] Lab: [phone] Dir: Rush Farmer, MD, MMM  Professional component performed at [facility], [facility], [place], [place],  [address] Lab: [phone] Dir: Dellia Nims. Reuel Derby, MD     Assessment/Plan: 1. Encounter for general adult medical examination with abnormal findings Annual health maintenance exam today. Will get lab results from biometric screening and update his chart.   2. Primary hypertension Stable. Continue bp medication as prescribed   3. Arteriosclerosis of coronary artery Stable. Continue regular visits with cardiology as scheduled.   4. Dyslipidemia Continue atorvastatin as prescribed.   5. Esophageal stricture Improved. Sees GI provider as scheduled.   6. BMI 37.0-37.9, adult Biometric exemption form completed and returned to patient during visit. Patient should limit calorie intake to 1500 calories per day and incorporate exercise into daily routine.   7. Needs flu shot Flu vaccine administered today.  - Flu Vaccine MDCK QUAD PF  8. Dysuria - UA/M w/rflx Culture, Routine  General Counseling: Dorell verbalizes understanding  of the findings of todays visit and agrees with plan of treatment. I have discussed any further diagnostic evaluation that may be needed or ordered today. We also reviewed his medications today. he has been encouraged to call the office with any questions or concerns that should arise related to todays visit.    Counseling: Obesity Counseling: Risk Assessment: An assessment of behavioral risk factors was made today and includes lack of exercise sedentary lifestyle, lack of portion control and poor dietary habits.  Risk Modification Advice: She was counseled on portion control guidelines. Restricting daily caloric intake to 1500 calories per day. The detrimental long term effects of obesity on her health and ongoing poor compliance was also discussed with the patient.  This patient was seen by Leretha Pol FNP Collaboration with Dr Lavera Guise as a part of collaborative care agreement  Orders Placed This Encounter  Procedures  . Microscopic Examination  . Flu Vaccine MDCK QUAD PF  . UA/M w/rflx Culture, Routine      Total time spent: 15 Minutes  Time spent includes review of chart, medications, test results, and follow up plan with the patient.     Lavera Guise, MD  Internal Medicine

## 2020-06-16 LAB — UA/M W/RFLX CULTURE, ROUTINE
Bilirubin, UA: NEGATIVE
Glucose, UA: NEGATIVE
Ketones, UA: NEGATIVE
Leukocytes,UA: NEGATIVE
Nitrite, UA: NEGATIVE
Protein,UA: NEGATIVE
RBC, UA: NEGATIVE
Specific Gravity, UA: 1.012 (ref 1.005–1.030)
Urobilinogen, Ur: 0.2 mg/dL (ref 0.2–1.0)
pH, UA: 5 (ref 5.0–7.5)

## 2020-06-16 LAB — MICROSCOPIC EXAMINATION
Bacteria, UA: NONE SEEN
Casts: NONE SEEN /lpf
Epithelial Cells (non renal): NONE SEEN /hpf (ref 0–10)
RBC, Urine: NONE SEEN /hpf (ref 0–2)
WBC, UA: NONE SEEN /hpf (ref 0–5)

## 2020-06-16 NOTE — Progress Notes (Signed)
reviewed

## 2020-06-19 ENCOUNTER — Ambulatory Visit
Admission: RE | Admit: 2020-06-19 | Discharge: 2020-06-19 | Disposition: A | Discharge status: Home / Self Care | Payer: 59 | Attending: Gastroenterology | Admitting: Gastroenterology

## 2020-06-19 ENCOUNTER — Ambulatory Visit: Payer: 59 | Admitting: Anesthesiology

## 2020-06-19 ENCOUNTER — Encounter: Payer: Self-pay | Admitting: *Deleted

## 2020-06-19 ENCOUNTER — Other Ambulatory Visit: Payer: Self-pay

## 2020-06-19 ENCOUNTER — Encounter
Admission: RE | Disposition: A | Discharge status: Home / Self Care | Payer: Self-pay | Source: Home / Self Care | Attending: Gastroenterology

## 2020-06-19 DIAGNOSIS — I2510 Atherosclerotic heart disease of native coronary artery without angina pectoris: Secondary | ICD-10-CM | POA: Insufficient documentation

## 2020-06-19 DIAGNOSIS — Z79899 Other long term (current) drug therapy: Secondary | ICD-10-CM | POA: Diagnosis not present

## 2020-06-19 DIAGNOSIS — Z7902 Long term (current) use of antithrombotics/antiplatelets: Secondary | ICD-10-CM | POA: Diagnosis not present

## 2020-06-19 DIAGNOSIS — E785 Hyperlipidemia, unspecified: Secondary | ICD-10-CM | POA: Diagnosis not present

## 2020-06-19 DIAGNOSIS — K200 Eosinophilic esophagitis: Secondary | ICD-10-CM | POA: Diagnosis present

## 2020-06-19 DIAGNOSIS — I10 Essential (primary) hypertension: Secondary | ICD-10-CM | POA: Insufficient documentation

## 2020-06-19 DIAGNOSIS — Z7982 Long term (current) use of aspirin: Secondary | ICD-10-CM | POA: Insufficient documentation

## 2020-06-19 DIAGNOSIS — Z9861 Coronary angioplasty status: Secondary | ICD-10-CM | POA: Diagnosis not present

## 2020-06-19 DIAGNOSIS — Z87891 Personal history of nicotine dependence: Secondary | ICD-10-CM | POA: Insufficient documentation

## 2020-06-19 HISTORY — PX: ESOPHAGOGASTRODUODENOSCOPY: SHX5428

## 2020-06-19 SURGERY — EGD (ESOPHAGOGASTRODUODENOSCOPY)
Anesthesia: General

## 2020-06-19 MED ORDER — PROPOFOL 500 MG/50ML IV EMUL
INTRAVENOUS | Status: DC | PRN
  Administered 2020-06-19: 150 ug/kg/min via INTRAVENOUS

## 2020-06-19 MED ORDER — SODIUM CHLORIDE 0.9 % IV SOLN
INTRAVENOUS | Status: DC
  Administered 2020-06-19: 20 mL/h via INTRAVENOUS

## 2020-06-19 MED ORDER — LIDOCAINE HCL (PF) 2 % IJ SOLN
INTRAMUSCULAR | Status: AC
  Filled 2020-06-19: qty 5

## 2020-06-19 MED ORDER — BUTAMBEN-TETRACAINE-BENZOCAINE 2-2-14 % EX AERO
INHALATION_SPRAY | CUTANEOUS | Status: AC
  Filled 2020-06-19: qty 5

## 2020-06-19 MED ORDER — PROPOFOL 500 MG/50ML IV EMUL
INTRAVENOUS | Status: AC
  Filled 2020-06-19: qty 50

## 2020-06-19 MED ORDER — LIDOCAINE HCL (CARDIAC) PF 100 MG/5ML IV SOSY
PREFILLED_SYRINGE | INTRAVENOUS | Status: DC | PRN
  Administered 2020-06-19: 100 mg via INTRAVENOUS

## 2020-06-19 NOTE — Anesthesia Procedure Notes (Signed)
Performed by: Cook-Martin, Torina Ey Pre-anesthesia Checklist: Patient identified, Emergency Drugs available, Suction available, Patient being monitored and Timeout performed Patient Re-evaluated:Patient Re-evaluated prior to induction Oxygen Delivery Method: Simple face mask Preoxygenation: Pre-oxygenation with 100% oxygen Induction Type: IV induction Airway Equipment and Method: Bite block Placement Confirmation: positive ETCO2 and CO2 detector       

## 2020-06-19 NOTE — Transfer of Care (Signed)
Immediate Anesthesia Transfer of Care Note  Patient: Lee Douglas  Procedure(s) Performed: ESOPHAGOGASTRODUODENOSCOPY (EGD) (N/A )  Patient Location: PACU  Anesthesia Type:General  Level of Consciousness: awake and sedated  Airway & Oxygen Therapy: Patient Spontanous Breathing and Patient connected to face mask oxygen  Post-op Assessment: Report given to RN and Post -op Vital signs reviewed and stable  Post vital signs: Reviewed and stable  Last Vitals:  Vitals Value Taken Time  BP    Temp    Pulse    Resp    SpO2      Last Pain:  Vitals:   06/19/20 0721  TempSrc: Temporal  PainSc: 0-No pain         Complications: No complications documented.

## 2020-06-19 NOTE — Op Note (Signed)
[facility] Gastroenterology Patient Name: Lee Douglas Procedure Date: 06/19/2020 8:02 AM MRN: [id number] Account #: [id number] Date of Birth: 1956/02/15 Admit Type: Outpatient Age: 64 Room: [facility] ENDO ROOM 1 Gender: Male Note Status: Finalized Procedure:             Upper GI endoscopy Indications:           Eosinophilic esophagitis Providers:             Andrey Farmer MD, MD Medicines:             Monitored Anesthesia Care Complications:         No immediate complications. Estimated blood loss:                         Minimal. Procedure:             Pre-Anesthesia Assessment:                        - Prior to the procedure, a History and Physical was                         performed, and patient medications and allergies were                         reviewed. The patient is competent. The risks and                         benefits of the procedure and the sedation options and                         risks were discussed with the patient. All questions                         were answered and informed consent was obtained.                         Patient identification and proposed procedure were                         verified by the physician, the nurse, the anesthetist                         and the technician in the endoscopy suite. Mental                         Status Examination: alert and oriented. Airway                         Examination: normal oropharyngeal airway and neck                         mobility. Respiratory Examination: clear to                         auscultation. CV Examination: normal. Prophylactic                         Antibiotics: The patient does not require prophylactic  antibiotics. Prior Anticoagulants: The patient has                         taken Plavix (clopidogrel), last dose was 5 days prior                         to procedure. ASA Grade Assessment: II - A patient                         with  mild systemic disease. After reviewing the risks                         and benefits, the patient was deemed in satisfactory                         condition to undergo the procedure. The anesthesia                         plan was to use monitored anesthesia care (MAC).                         Immediately prior to administration of medications,                         the patient was re-assessed for adequacy to receive                         sedatives. The heart rate, respiratory rate, oxygen                         saturations, blood pressure, adequacy of pulmonary                         ventilation, and response to care were monitored                         throughout the procedure. The physical status of the                         patient was re-assessed after the procedure.                        After obtaining informed consent, the endoscope was                         passed under direct vision. Throughout the procedure,                         the patient's blood pressure, pulse, and oxygen                         saturations were monitored continuously. The Endoscope                         was introduced through the mouth, and advanced to the                         second part of duodenum. The  upper GI endoscopy was                         accomplished without difficulty. The patient tolerated                         the procedure well. Findings:      The examined esophagus was normal. Biopsies were obtained from the       proximal and distal esophagus with cold forceps for histology of       suspected eosinophilic esophagitis. Estimated blood loss was minimal.      The entire examined stomach was normal.      The examined duodenum was normal. Impression:            - Normal esophagus. Biopsied.                        - Normal stomach.                        - Normal examined duodenum. Recommendation:        - Discharge patient to home.                        - Resume  previous diet.                        - Continue present medications.                        - Resume Plavix (clopidogrel) at prior dose tomorrow.                        - Await pathology results.                        - Return to GI clinic as previously scheduled. Procedure Code(s):     --- Professional ---                        [phone], Esophagogastroduodenoscopy, flexible,                         transoral; with biopsy, single or multiple Diagnosis Code(s):     --- Professional ---                        W97.9, Eosinophilic esophagitis CPT copyright 2019 American Medical Association. All rights reserved. The codes documented in this report are preliminary and upon coder review may  be revised to meet current compliance requirements. Andrey Farmer, MD Andrey Farmer MD, MD 06/19/2020 8:21:42 AM Number of Addenda: 0 Note Initiated On: 06/19/2020 8:02 AM Estimated Blood Loss:  Estimated blood loss was minimal.      [facility]

## 2020-06-19 NOTE — Anesthesia Postprocedure Evaluation (Signed)
Anesthesia Post Note  Patient: Lee Douglas  Procedure(s) Performed: ESOPHAGOGASTRODUODENOSCOPY (EGD) (N/A )  Patient location during evaluation: PACU Anesthesia Type: General Level of consciousness: awake and alert Pain management: pain level controlled Vital Signs Assessment: post-procedure vital signs reviewed and stable Respiratory status: spontaneous breathing, nonlabored ventilation, respiratory function stable and patient connected to nasal cannula oxygen Cardiovascular status: blood pressure returned to baseline and stable Postop Assessment: no apparent nausea or vomiting Anesthetic complications: no   No complications documented.   Last Vitals:  Vitals:   06/19/20 0829 06/19/20 0839  BP: (!) 94/58 93/60  Pulse: 63 (!) 59  Resp: (!) 21 18  Temp:    SpO2: 93% 94%    Last Pain:  Vitals:   06/19/20 0839  TempSrc:   PainSc: 0-No pain                 Molli Barrows

## 2020-06-19 NOTE — H&P (Signed)
Outpatient short stay form Pre-procedure 06/19/2020 7:59 AM Raylene Miyamoto MD, MPH  Primary Physician: NP Junius Creamer  Reason for visit:  History of EOE  History of present illness:   64 y/o gentleman with recent EGD for dysphagia that showed EoE. He was put on high dose PPI and his dysphagia has resolved. He takes plavix with last dose 5 days ago. No abdominal surgeries    Current Facility-Administered Medications:  .  0.9 %  sodium chloride infusion, , Intravenous, Continuous, Dheeraj Hail, Hilton Cork, MD, Last Rate: 20 mL/hr at 06/19/20 0738, Continued from Pre-op at 06/19/20 0738 .  butamben-tetracaine-benzocaine (CETACAINE) 10-11-12 % spray, , , ,   Medications Prior to Admission  Medication Sig Dispense Refill Last Dose  . aspirin 81 MG tablet Take by mouth.   06/18/2020 at Unknown time  . atorvastatin (LIPITOR) 80 MG tablet    06/18/2020 at Unknown time  . clopidogrel (PLAVIX) 75 MG tablet    Past Week at Unknown time  . lisinopril (PRINIVIL,ZESTRIL) 10 MG tablet    06/19/2020 at 0600  . Multiple Vitamins-Minerals (MULTIVITAMIN ADULT PO) Take by mouth.   06/18/2020 at Unknown time  . nitroGLYCERIN (NITROSTAT) 0.4 MG SL tablet    Past Month at Unknown time  . pantoprazole (PROTONIX) 20 MG tablet Take 20 mg by mouth 2 (two) times daily.   06/19/2020 at 0600  . Omega-3 1000 MG CAPS Take by mouth. (Patient not taking: Reported on 06/19/2020)   Completed Course at Unknown time     Allergies  Allergen Reactions  . Codeine Nausea Only     Past Medical History:  Diagnosis Date  . Hyperlipidemia   . Hypertension     Review of systems:  Otherwise negative.    Physical Exam  Gen: Alert, oriented. Appears stated age.  HEENT: [place]/AT. PERRLA. Lungs: No respiratory distress Abd: soft, benign, no masses.  Ext: No edema.     Planned procedures: Proceed with EGD. The patient understands the nature of the planned procedure, indications, risks, alternatives and potential complications  including but not limited to bleeding, infection, perforation, damage to internal organs and possible oversedation/side effects from anesthesia. The patient agrees and gives consent to proceed.  Please refer to procedure notes for findings, recommendations and patient disposition/instructions.     Raylene Miyamoto MD, MPH Gastroenterology 06/19/2020  7:59 AM

## 2020-06-19 NOTE — Interval H&P Note (Signed)
History and Physical Interval Note:  06/19/2020 8:01 AM  Lee Douglas  has presented today for surgery, with the diagnosis of ESOPHAGITIS.  The various methods of treatment have been discussed with the patient and family. After consideration of risks, benefits and other options for treatment, the patient has consented to  Procedure(s): ESOPHAGOGASTRODUODENOSCOPY (EGD) (N/A) as a surgical intervention.  The patient's history has been reviewed, patient examined, no change in status, stable for surgery.  I have reviewed the patient's chart and labs.  Questions were answered to the patient's satisfaction.     Lesly Rubenstein  Ok to proceed with EGD.

## 2020-06-19 NOTE — Anesthesia Preprocedure Evaluation (Signed)
Anesthesia Evaluation  Patient identified by MRN, date of birth, ID band Patient awake    Reviewed: Allergy & Precautions, H&P , NPO status , Patient's Chart, lab work & pertinent test results, reviewed documented beta blocker date and time   Airway Mallampati: II   Neck ROM: full    Dental  (+) Poor Dentition   Pulmonary neg pulmonary ROS, former smoker,    Pulmonary exam normal        Cardiovascular Exercise Tolerance: Good hypertension, On Medications + CAD  Normal cardiovascular exam Rhythm:regular Rate:Normal     Neuro/Psych negative neurological ROS  negative psych ROS   GI/Hepatic negative GI ROS, Neg liver ROS,   Endo/Other  negative endocrine ROS  Renal/GU negative Renal ROS  negative genitourinary   Musculoskeletal   Abdominal   Peds  Hematology negative hematology ROS (+)   Anesthesia Other Findings Past Medical History: No date: Hyperlipidemia No date: Hypertension Past Surgical History: 04/18/2020: COLONOSCOPY WITH PROPOFOL; N/A     Comment:  Procedure: COLONOSCOPY WITH PROPOFOL;  Surgeon:               Lesly Rubenstein, MD;  Location: [facility] ENDOSCOPY;                Service: Endoscopy;  Laterality: N/A; No date: CORONARY ANGIOPLASTY 04/18/2020: ESOPHAGOGASTRODUODENOSCOPY (EGD) WITH PROPOFOL; N/A     Comment:  Procedure: ESOPHAGOGASTRODUODENOSCOPY (EGD) WITH               PROPOFOL;  Surgeon: Lesly Rubenstein, MD;  Location:               [facility] ENDOSCOPY;  Service: Endoscopy;  Laterality: N/A; No date: EXPLORATION POST OPERATIVE OPEN HEART No date: HERNIA REPAIR No date: SINUS SURGERY WITH INSTATRAK   Reproductive/Obstetrics negative OB ROS                             Anesthesia Physical Anesthesia Plan  ASA: III  Anesthesia Plan: General   Post-op Pain Management:    Induction:   PONV Risk Score and Plan:   Airway Management Planned:   Additional  Equipment:   Intra-op Plan:   Post-operative Plan:   Informed Consent: I have reviewed the patients History and Physical, chart, labs and discussed the procedure including the risks, benefits and alternatives for the proposed anesthesia with the patient or authorized representative who has indicated his/her understanding and acceptance.     Dental Advisory Given  Plan Discussed with: CRNA  Anesthesia Plan Comments:         Anesthesia Quick Evaluation

## 2020-06-20 ENCOUNTER — Encounter: Payer: Self-pay | Admitting: Gastroenterology

## 2020-06-21 LAB — SURGICAL PATHOLOGY

## 2020-07-02 DIAGNOSIS — R300 Dysuria: Secondary | ICD-10-CM | POA: Insufficient documentation

## 2020-07-02 DIAGNOSIS — Z0001 Encounter for general adult medical examination with abnormal findings: Secondary | ICD-10-CM | POA: Insufficient documentation

## 2020-07-02 DIAGNOSIS — Z6837 Body mass index (BMI) 37.0-37.9, adult: Secondary | ICD-10-CM | POA: Insufficient documentation

## 2020-07-02 DIAGNOSIS — Z23 Encounter for immunization: Secondary | ICD-10-CM | POA: Insufficient documentation

## 2020-12-14 ENCOUNTER — Ambulatory Visit: Payer: 59 | Admitting: Hospice and Palliative Medicine

## 2021-06-18 ENCOUNTER — Encounter: Payer: 59 | Admitting: Nurse Practitioner

## 2022-01-08 ENCOUNTER — Telehealth: Payer: 59 | Admitting: Nurse Practitioner

## 2022-01-08 ENCOUNTER — Ambulatory Visit: Payer: 59

## 2022-01-08 ENCOUNTER — Encounter: Payer: Self-pay | Admitting: Nurse Practitioner

## 2022-01-08 VITALS — Temp 99.0°F | Ht 71.0 in | Wt 265.0 lb

## 2022-01-08 DIAGNOSIS — J069 Acute upper respiratory infection, unspecified: Secondary | ICD-10-CM

## 2022-01-08 DIAGNOSIS — U071 COVID-19: Secondary | ICD-10-CM

## 2022-01-08 DIAGNOSIS — R051 Acute cough: Secondary | ICD-10-CM

## 2022-01-08 DIAGNOSIS — R062 Wheezing: Secondary | ICD-10-CM

## 2022-01-08 MED ORDER — PREDNISONE 10 MG PO TABS: ORAL_TABLET | ORAL | 0 refills | Status: DC

## 2022-01-08 MED ORDER — MOLNUPIRAVIR EUA 200MG CAPSULE: 4.0000 | ORAL_CAPSULE | Freq: Two times a day (BID) | ORAL | 0 refills | Status: AC

## 2022-01-08 NOTE — Progress Notes (Signed)
[facility] ?[address] ?[place], [place] [address] ? ?Internal MEDICINE  ?Telephone Visit ? ?Patient Name: Lee Douglas ? [id number]  ?[id number] ? ?Date of Service: 01/08/2022 ? ?I connected with the patient at 2:30 PM by telephone and verified the patients identity using two identifiers.   ?I discussed the limitations, risks, security and privacy concerns of performing an evaluation and management service by telephone and the availability of in person appointments. I also discussed with the patient that there may be a patient responsible charge related to the service.  The patient expressed understanding and agrees to proceed.   ? ?Chief Complaint  ?Patient presents with  ? Telephone Assessment  ?  Covid positive  ? Telephone Screen  ?  [phone]  ? Sinusitis  ? Headache  ? Cough  ? ? ?HPI ?Lee Douglas presents for a telehealth virtual visit for upper respiratory infection due to COVID.  Patient was encouraged to retest today for COVID and he was positive.  He has reported flulike symptoms as well as cough, headache and symptoms of sinusitis. Symptoms started on Sunday.  Patient would like to take antiviral medication to decrease symptoms and severity of COVID infection.  He also reports wheezing and shortness of breath.  He is taking over-the-counter cough suppressant for relief of cough.  He is well overdue for routine annual physical exam and routine labs.  He was last seen in office in 2021 with a provider who is no longer seeing patients at the clinic.  Encouraged patient to schedule routine annual physical exam once he is feeling better and has completed the course of antiviral medication for COVID infection. ? ? ?Current Medication: ?Outpatient Encounter Medications as of 01/08/2022  ?Medication Sig Note  ? aspirin 81 MG tablet Take by mouth. 02/20/2016: Received from: [facility]  ? atorvastatin (LIPITOR) 80 MG tablet  02/20/2016: Received from: External Pharmacy  ? clopidogrel (PLAVIX)  75 MG tablet  02/20/2016: Received from: External Pharmacy  ? lisinopril (PRINIVIL,ZESTRIL) 10 MG tablet  02/20/2016: Received from: External Pharmacy  ? molnupiravir EUA (LAGEVRIO) 200 mg CAPS capsule Take 4 capsules (800 mg total) by mouth 2 (two) times daily for 5 days.   ? Multiple Vitamins-Minerals (MULTIVITAMIN ADULT PO) Take by mouth. 02/20/2016: Received from: [facility]  ? nitroGLYCERIN (NITROSTAT) 0.4 MG SL tablet  02/20/2016: Received from: External Pharmacy  ? pantoprazole (PROTONIX) 20 MG tablet Take 20 mg by mouth 2 (two) times daily.   ? predniSONE (DELTASONE) 10 MG tablet Take one tab 3 x day for 3 days, then take one tab 2 x a day for 3 days and then take one tab a day for 3 days for URI   ? [DISCONTINUED] Omega-3 1000 MG CAPS Take by mouth. (Patient not taking: Reported on 06/19/2020) 02/20/2016: Received from: [facility]  ? ?No facility-administered encounter medications on file as of 01/08/2022.  ? ? ?Surgical History: ?Past Surgical History:  ?Procedure Laterality Date  ? COLONOSCOPY WITH PROPOFOL N/A 04/18/2020  ? Procedure: COLONOSCOPY WITH PROPOFOL;  Surgeon: Lesly Rubenstein, MD;  Location: [facility] ENDOSCOPY;  Service: Endoscopy;  Laterality: N/A;  ? CORONARY ANGIOPLASTY    ? ESOPHAGOGASTRODUODENOSCOPY N/A 06/19/2020  ? Procedure: ESOPHAGOGASTRODUODENOSCOPY (EGD);  Surgeon: Lesly Rubenstein, MD;  Location: [facility] ENDOSCOPY;  Service: Endoscopy;  Laterality: N/A;  ? ESOPHAGOGASTRODUODENOSCOPY (EGD) WITH PROPOFOL N/A 04/18/2020  ? Procedure: ESOPHAGOGASTRODUODENOSCOPY (EGD) WITH PROPOFOL;  Surgeon: Lesly Rubenstein, MD;  Location: [facility] ENDOSCOPY;  Service: Endoscopy;  Laterality: N/A;  ? EXPLORATION POST OPERATIVE OPEN HEART    ? HERNIA REPAIR    ? SINUS SURGERY WITH INSTATRAK    ? ? ?Medical History: ?Past Medical History:  ?Diagnosis Date  ? Hyperlipidemia   ? Hypertension   ? Myocardial infarction [facility])   ? ? ?Family History: ?Family History  ?Problem  Relation Age of Onset  ? Stroke Father   ? ? ?Social History  ? ?Socioeconomic History  ? Marital status: Married  ?  Spouse name: Not on file  ? Number of children: Not on file  ? Years of education: Not on file  ? Highest education level: Not on file  ?Occupational History  ? Not on file  ?Tobacco Use  ? Smoking status: Former  ? Smokeless tobacco: Current  ?  Types: Chew  ?Vaping Use  ? Vaping Use: Never used  ?Substance and Sexual Activity  ? Alcohol use: Never  ?  Alcohol/week: 0.0 standard drinks  ? Drug use: Never  ? Sexual activity: Not on file  ?Other Topics Concern  ? Not on file  ?Social History Narrative  ? Not on file  ? ?Social Determinants of Health  ? ?Financial Resource Strain: Not on file  ?Food Insecurity: Not on file  ?Transportation Needs: Not on file  ?Physical Activity: Not on file  ?Stress: Not on file  ?Social Connections: Not on file  ?Intimate Partner Violence: Not on file  ? ? ? ? ?Review of Systems  ?Constitutional:  Positive for chills, fatigue and fever.  ?HENT:  Negative for congestion, ear pain, postnasal drip, rhinorrhea, sinus pressure, sinus pain, sneezing and sore throat.   ?Respiratory:  Positive for cough, chest tightness, shortness of breath and wheezing.   ?Cardiovascular: Negative.  Negative for chest pain and palpitations.  ?Gastrointestinal: Negative.  Negative for abdominal pain, constipation, diarrhea, nausea and vomiting.  ?Musculoskeletal:  Positive for back pain and myalgias.  ?Skin: Negative.  Negative for rash.  ?Neurological:  Positive for headaches. Negative for dizziness and light-headedness.  ? ?Vital Signs: ?Temp 99 ?F (37.2 ?C)   Ht '5\' 11"'$  (1.803 m)   Wt 265 lb (120.2 kg)   BMI 36.96 kg/m?  ? ? ?Observation/Objective: ?He is alert and oriented and engages in conversation appropriately.  He does not appear to be in any acute distress over video call.  ? ? ? ?Assessment/Plan: ?1. Upper respiratory tract infection due to COVID-19 virus ?Tested positive for  COVID today, symptoms started 2 days ago.  Molnupiravir prescribed instead of Paxlovid due to interactions between Paxlovid and atorvastatin and Plavix.  Patient has no drug interactions between molnupiravir and his current medications.  He has no kidney or liver problems. ?- molnupiravir EUA (LAGEVRIO) 200 mg CAPS capsule; Take 4 capsules (800 mg total) by mouth 2 (two) times daily for 5 days.  Dispense: 40 capsule; Refill: 0 ? ?2. Wheezing ?Due to acute wheezing related to COVID respiratory infection, prednisone taper prescribed to decrease inflammation and improve breathing. ?- predniSONE (DELTASONE) 10 MG tablet; Take one tab 3 x day for 3 days, then take one tab 2 x a day for 3 days and then take one tab a day for 3 days for URI  Dispense: 18 tablet; Refill: 0 ? ?3. Acute cough ?Patient is currently using over-the-counter medications to alleviate cough, may continue over-the-counter medication if desired.  Encourage patient to call the clinic and request prescription for cough suppressant if his cough worsens and the over-the-counter  medication is not alleviating symptoms. ? ? ?General Counseling: Fountain verbalizes understanding of the findings of today's phone visit and agrees with plan of treatment. I have discussed any further diagnostic evaluation that may be needed or ordered today. We also reviewed his medications today. he has been encouraged to call the office with any questions or concerns that should arise related to todays visit. ? ?Return if symptoms worsen or fail to improve, for Need CPE scheduled for next office visit.. ? ? ?No orders of the defined types were placed in this encounter. ? ? ?Meds ordered this encounter  ?Medications  ? molnupiravir EUA (LAGEVRIO) 200 mg CAPS capsule  ?  Sig: Take 4 capsules (800 mg total) by mouth 2 (two) times daily for 5 days.  ?  Dispense:  40 capsule  ?  Refill:  0  ? predniSONE (DELTASONE) 10 MG tablet  ?  Sig: Take one tab 3 x day for 3 days, then take one tab  2 x a day for 3 days and then take one tab a day for 3 days for URI  ?  Dispense:  18 tablet  ?  Refill:  0  ? ? ?Time spent:10 Minutes ?Time spent with patient included reviewing progress notes, labs, imaging s

## 2022-01-30 ENCOUNTER — Telehealth: Payer: Self-pay

## 2022-01-30 NOTE — Telephone Encounter (Signed)
Left vm and sent mychart message to confirm 02/06/22 appointment-Toni

## 2022-02-06 ENCOUNTER — Ambulatory Visit (INDEPENDENT_AMBULATORY_CARE_PROVIDER_SITE_OTHER): Payer: 59 | Admitting: Nurse Practitioner

## 2022-02-06 ENCOUNTER — Encounter: Payer: Self-pay | Admitting: Nurse Practitioner

## 2022-02-06 VITALS — BP 122/73 | HR 70 | Temp 98.4°F | Resp 16 | Ht 71.0 in | Wt 247.6 lb

## 2022-02-06 DIAGNOSIS — R7303 Prediabetes: Secondary | ICD-10-CM

## 2022-02-06 DIAGNOSIS — I2510 Atherosclerotic heart disease of native coronary artery without angina pectoris: Secondary | ICD-10-CM | POA: Diagnosis not present

## 2022-02-06 DIAGNOSIS — E785 Hyperlipidemia, unspecified: Secondary | ICD-10-CM | POA: Diagnosis not present

## 2022-02-06 DIAGNOSIS — K222 Esophageal obstruction: Secondary | ICD-10-CM

## 2022-02-06 DIAGNOSIS — Z125 Encounter for screening for malignant neoplasm of prostate: Secondary | ICD-10-CM

## 2022-02-06 DIAGNOSIS — D229 Melanocytic nevi, unspecified: Secondary | ICD-10-CM

## 2022-02-06 DIAGNOSIS — R300 Dysuria: Secondary | ICD-10-CM

## 2022-02-06 DIAGNOSIS — Z0001 Encounter for general adult medical examination with abnormal findings: Secondary | ICD-10-CM | POA: Diagnosis not present

## 2022-02-06 DIAGNOSIS — I10 Essential (primary) hypertension: Secondary | ICD-10-CM | POA: Diagnosis not present

## 2022-02-06 DIAGNOSIS — E559 Vitamin D deficiency, unspecified: Secondary | ICD-10-CM

## 2022-02-06 LAB — POCT GLYCOSYLATED HEMOGLOBIN (HGB A1C): Hemoglobin A1C: 6 % — AB (ref 4.0–5.6)

## 2022-02-06 NOTE — Progress Notes (Signed)
[facility] [place], [place] [address]  Internal MEDICINE  Office Visit Note  Patient Name: Lee Douglas  [id number]  [id number]  Date of Service: 02/06/2022  Chief Complaint  Patient presents with   Annual Exam   Hyperlipidemia   Hypertension    HPI Lee Douglas presents for an annual well visit and physical exam.  He is a well-appearing 66 year old male with hypertension, history of esophageal stricture, hyperlipidemia.  He is overdue for his annual physical exam which is why he is being seen in the office today.  Earlier this month he had a virtual visit for COVID respiratory infection and was treated with molnupiravir. MI in 2004, had open heart surgery at that time.  He had a routine colonoscopy in 2021 and is not due for repeat until 2031.  He is due for routine labs According to his A1c in June last year he was prediabetic at 6.3. A1C is 6.0 today which is improved.  His triglyceride level was greater than 400 last year and his LDL was not able to be calculated.  He is due for routine labs.  There is also no recorded PSA on his chart so will order this as well for prostate cancer screening.   Current Medication: Outpatient Encounter Medications as of 02/06/2022  Medication Sig Note   aspirin 81 MG tablet Take by mouth. 02/20/2016: Received from: [facility]   atorvastatin (LIPITOR) 80 MG tablet  02/20/2016: Received from: External Pharmacy   clopidogrel (PLAVIX) 75 MG tablet  02/20/2016: Received from: External Pharmacy   icosapent Ethyl (VASCEPA) 1 g capsule Take 2 capsules by mouth 2 (two) times daily.    lisinopril (ZESTRIL) 20 MG tablet TAKE 1 TABLET BY MOUTH ONCE DAILY AND ONE-HALF TABLET  BY MOUTH AT NIGHT    metoprolol tartrate (LOPRESSOR) 25 MG tablet Take 1 tablet by mouth 2 (two) times daily.    Multiple Vitamins-Minerals (MULTIVITAMIN ADULT PO) Take by mouth. 02/20/2016: Received from: [facility]   nitroGLYCERIN  (NITROSTAT) 0.4 MG SL tablet  02/20/2016: Received from: External Pharmacy   pantoprazole (PROTONIX) 20 MG tablet Take 20 mg by mouth 2 (two) times daily.    [DISCONTINUED] lisinopril (PRINIVIL,ZESTRIL) 10 MG tablet  02/20/2016: Received from: External Pharmacy   [DISCONTINUED] pantoprazole (PROTONIX) 20 MG tablet Take by mouth.    [DISCONTINUED] predniSONE (DELTASONE) 10 MG tablet Take one tab 3 x day for 3 days, then take one tab 2 x a day for 3 days and then take one tab a day for 3 days for URI    No facility-administered encounter medications on file as of 02/06/2022.    Surgical History: Past Surgical History:  Procedure Laterality Date   COLONOSCOPY WITH PROPOFOL N/A 04/18/2020   Procedure: COLONOSCOPY WITH PROPOFOL;  Surgeon: Lesly Rubenstein, MD;  Location: [facility] ENDOSCOPY;  Service: Endoscopy;  Laterality: N/A;   CORONARY ANGIOPLASTY     ESOPHAGOGASTRODUODENOSCOPY N/A 06/19/2020   Procedure: ESOPHAGOGASTRODUODENOSCOPY (EGD);  Surgeon: Lesly Rubenstein, MD;  Location: [facility] ENDOSCOPY;  Service: Endoscopy;  Laterality: N/A;   ESOPHAGOGASTRODUODENOSCOPY (EGD) WITH PROPOFOL N/A 04/18/2020   Procedure: ESOPHAGOGASTRODUODENOSCOPY (EGD) WITH PROPOFOL;  Surgeon: Lesly Rubenstein, MD;  Location: [facility] ENDOSCOPY;  Service: Endoscopy;  Laterality: N/A;   EXPLORATION POST OPERATIVE OPEN HEART     HERNIA REPAIR     SINUS SURGERY WITH INSTATRAK      Medical History: Past Medical History:  Diagnosis Date   Hyperlipidemia  Hypertension    Myocardial infarction [facility])     Family History: Family History  Problem Relation Age of Onset   Stroke Father     Social History   Socioeconomic History   Marital status: Married    Spouse name: Not on file   Number of children: Not on file   Years of education: Not on file   Highest education level: Not on file  Occupational History   Not on file  Tobacco Use   Smoking status: Former   Smokeless tobacco: Current    Types: Chew   Vaping Use   Vaping Use: Never used  Substance and Sexual Activity   Alcohol use: Never    Alcohol/week: 0.0 standard drinks of alcohol   Drug use: Never   Sexual activity: Not on file  Other Topics Concern   Not on file  Social History Narrative   Not on file   Social Determinants of Health   Financial Resource Strain: Not on file  Food Insecurity: Not on file  Transportation Needs: Not on file  Physical Activity: Not on file  Stress: Not on file  Social Connections: Not on file  Intimate Partner Violence: Not on file      Review of Systems  Constitutional:  Negative for activity change, appetite change, chills, fatigue, fever and unexpected weight change.  HENT: Negative.  Negative for congestion, ear pain, rhinorrhea, sore throat and trouble swallowing.   Eyes: Negative.   Respiratory: Negative.  Negative for cough, chest tightness, shortness of breath and wheezing.   Cardiovascular: Negative.  Negative for chest pain.  Gastrointestinal: Negative.  Negative for abdominal pain, blood in stool, constipation, diarrhea, nausea and vomiting.  Endocrine: Negative.   Genitourinary: Negative.  Negative for difficulty urinating, dysuria, frequency, hematuria and urgency.  Musculoskeletal: Negative.  Negative for arthralgias, back pain, joint swelling, myalgias and neck pain.  Skin: Negative.  Negative for rash and wound.  Allergic/Immunologic: Negative.  Negative for immunocompromised state.  Neurological: Negative.  Negative for dizziness, seizures, numbness and headaches.  Hematological: Negative.   Psychiatric/Behavioral: Negative.  Negative for behavioral problems, self-injury and suicidal ideas. The patient is not nervous/anxious.     Vital Signs: BP 122/73   Pulse 70   Temp 98.4 F (36.9 C)   Resp 16   Ht 5' 11"  (1.803 m)   Wt 247 lb 9.6 oz (112.3 kg)   SpO2 97%   BMI 34.53 kg/m    Physical Exam Vitals reviewed.  Constitutional:      General: He is awake.  He is not in acute distress.    Appearance: Normal appearance. He is well-developed and well-groomed. He is obese. He is not ill-appearing or diaphoretic.  HENT:     Head: Normocephalic and atraumatic.     Right Ear: Tympanic membrane, ear canal and external ear normal.     Left Ear: Tympanic membrane, ear canal and external ear normal.     Nose: Nose normal. No congestion or rhinorrhea.     Mouth/Throat:     Lips: Pink.     Mouth: Mucous membranes are moist.     Pharynx: Oropharynx is clear. Uvula midline. No oropharyngeal exudate or posterior oropharyngeal erythema.  Eyes:     General: Lids are normal. Vision grossly intact. Gaze aligned appropriately. No scleral icterus.       Right eye: No discharge.        Left eye: No discharge.     Extraocular Movements: Extraocular movements intact.  Conjunctiva/sclera: Conjunctivae normal.     Pupils: Pupils are equal, round, and reactive to light.     Funduscopic exam:    Right eye: Red reflex present.        Left eye: Red reflex present. Neck:     Thyroid: No thyromegaly.     Vascular: No JVD.     Trachea: Trachea and phonation normal. No tracheal deviation.  Cardiovascular:     Rate and Rhythm: Normal rate and regular rhythm.     Pulses: Normal pulses.     Heart sounds: Normal heart sounds, S1 normal and S2 normal. No murmur heard.    No friction rub. No gallop.  Pulmonary:     Effort: Pulmonary effort is normal. No accessory muscle usage or respiratory distress.     Breath sounds: Normal breath sounds and air entry. No stridor. No wheezing or rales.  Chest:     Chest wall: No tenderness.  Abdominal:     General: Bowel sounds are normal. There is no distension.     Palpations: Abdomen is soft. There is no shifting dullness, fluid wave, mass or pulsatile mass.     Tenderness: There is no abdominal tenderness. There is no guarding or rebound.  Musculoskeletal:        General: No tenderness or deformity. Normal range of motion.      Cervical back: Normal range of motion and neck supple.     Right lower leg: No edema.     Left lower leg: No edema.  Lymphadenopathy:     Cervical: No cervical adenopathy.  Skin:    General: Skin is warm and dry.     Capillary Refill: Capillary refill takes less than 2 seconds.     Coloration: Skin is not pale.     Findings: No erythema or rash.  Neurological:     Mental Status: He is alert and oriented to person, place, and time.     Cranial Nerves: No cranial nerve deficit.     Motor: No abnormal muscle tone.     Coordination: Coordination normal.     Gait: Gait normal.     Deep Tendon Reflexes: Reflexes are normal and symmetric.  Psychiatric:        Mood and Affect: Mood normal.        Behavior: Behavior normal. Behavior is cooperative.        Thought Content: Thought content normal.        Judgment: Judgment normal.        Assessment/Plan: 1. Encounter for general adult medical examination with abnormal findings Age-appropriate preventive screenings and vaccinations discussed, annual physical exam completed. Routine labs for health maintenance ordered, see below. PHM updated.  - Lipid Profile - CBC with Differential/Platelet - CMP14+EGFR  2. Primary hypertension Stable and controlled, routine labs ordered.  - Lipid Profile - CBC with Differential/Platelet - CMP14+EGFR  3. Prediabetes A1c is 6.0, discussed diet and lifestyle modifications. Routine labs ordered.  - POCT glycosylated hemoglobin (Hb A1C) - Lipid Profile - CBC with Differential/Platelet - CMP14+EGFR  4. Arteriosclerosis of coronary artery Routine labs ordered - Lipid Profile - CBC with Differential/Platelet - CMP14+EGFR  5. Dyslipidemia Routine labs ordered - Lipid Profile - CBC with Differential/Platelet - CMP14+EGFR  6. Esophageal stricture Routine labs ordered, has seen GI in the past - Lipid Profile - CBC with Differential/Platelet - CMP14+EGFR  7. Vitamin D deficiency Routine  labs ordered - Lipid Profile - CBC with Differential/Platelet - CMP14+EGFR - Vitamin D (25  hydroxy)  8. Atypical mole Refer to derm - Ambulatory referral to Dermatology  9. Dysuria Routine urinalysis done - UA/M w/rflx Culture, Routine  10. Screening for prostate cancer Routine lab ordered - PSA Total (Reflex To Free)      General Counseling: Lee Douglas verbalizes understanding of the findings of todays visit and agrees with plan of treatment. I have discussed any further diagnostic evaluation that may be needed or ordered today. We also reviewed his medications today. he has been encouraged to call the office with any questions or concerns that should arise related to todays visit.    Orders Placed This Encounter  Procedures   Microscopic Examination   Lipid Profile   CBC with Differential/Platelet   CMP14+EGFR   PSA Total (Reflex To Free)   Vitamin D (25 hydroxy)   UA/M w/rflx Culture, Routine   Ambulatory referral to Dermatology   POCT glycosylated hemoglobin (Hb A1C)    No orders of the defined types were placed in this encounter.   Return in about 1 year (around 02/07/2023) for CPE, [facility] PCP.   Total time spent:30 Minutes Time spent includes review of chart, medications, test results, and follow up plan with the patient.   [place] Controlled Substance Database was reviewed by me.  This patient was seen by Jonetta Osgood, FNP-C in collaboration with Dr. Clayborn Bigness as a part of collaborative care agreement.  Teela Narducci R. Valetta Fuller, MSN, FNP-C Internal medicine

## 2022-02-07 LAB — UA/M W/RFLX CULTURE, ROUTINE
Bilirubin, UA: NEGATIVE
Glucose, UA: NEGATIVE
Ketones, UA: NEGATIVE
Leukocytes,UA: NEGATIVE
Nitrite, UA: NEGATIVE
Protein,UA: NEGATIVE
RBC, UA: NEGATIVE
Specific Gravity, UA: 1.02 (ref 1.005–1.030)
Urobilinogen, Ur: 0.2 mg/dL (ref 0.2–1.0)
pH, UA: 5 (ref 5.0–7.5)

## 2022-02-07 LAB — MICROSCOPIC EXAMINATION
Bacteria, UA: NONE SEEN
Casts: NONE SEEN /lpf
Epithelial Cells (non renal): NONE SEEN /hpf (ref 0–10)
RBC, Urine: NONE SEEN /hpf (ref 0–2)
WBC, UA: NONE SEEN /hpf (ref 0–5)

## 2022-03-10 ENCOUNTER — Encounter: Payer: Self-pay | Admitting: Nurse Practitioner

## 2022-08-14 ENCOUNTER — Ambulatory Visit: Payer: 59 | Admitting: Dermatology

## 2022-11-21 ENCOUNTER — Ambulatory Visit: Payer: 59 | Admitting: Dermatology

## 2022-11-27 ENCOUNTER — Telehealth: Payer: 59 | Admitting: Nurse Practitioner

## 2023-02-13 ENCOUNTER — Ambulatory Visit (INDEPENDENT_AMBULATORY_CARE_PROVIDER_SITE_OTHER): Payer: Medicare PPO | Admitting: Nurse Practitioner

## 2023-02-13 ENCOUNTER — Encounter: Payer: Self-pay | Admitting: Nurse Practitioner

## 2023-02-13 VITALS — BP 135/80 | HR 76 | Temp 98.2°F | Resp 16 | Ht 71.0 in | Wt 276.2 lb

## 2023-02-13 DIAGNOSIS — E785 Hyperlipidemia, unspecified: Secondary | ICD-10-CM

## 2023-02-13 DIAGNOSIS — R300 Dysuria: Secondary | ICD-10-CM | POA: Diagnosis not present

## 2023-02-13 DIAGNOSIS — I10 Essential (primary) hypertension: Secondary | ICD-10-CM | POA: Diagnosis not present

## 2023-02-13 DIAGNOSIS — R7303 Prediabetes: Secondary | ICD-10-CM | POA: Diagnosis not present

## 2023-02-13 DIAGNOSIS — Z0001 Encounter for general adult medical examination with abnormal findings: Secondary | ICD-10-CM

## 2023-02-13 NOTE — Progress Notes (Signed)
[facility] [address] [place], [place] [address]  Internal MEDICINE  Office Visit Note  Patient Name: Lee Douglas  [id number]  [id number]  Date of Service: 02/13/2023  Chief Complaint  Patient presents with   Medicare Wellness   Hyperlipidemia   Hypertension    HPI Lee Douglas presents for an annual well visit and physical exam.  Well-appearing 66 y.o.male for hypertension, hyperlipidemia, CAD, eosinophilic esophagitis, and esophageal stricture Routine CRC screening: due in 2031 Labs: will have labs drawn at [facility] this month New or worsening pain: none Other concerns: none Overdue for PSA level.     Current Medication: Outpatient Encounter Medications as of 02/13/2023  Medication Sig Note   aspirin 81 MG tablet Take by mouth. 02/20/2016: Received from: [facility]   atorvastatin (LIPITOR) 80 MG tablet  02/20/2016: Received from: External Pharmacy   clopidogrel (PLAVIX) 75 MG tablet  02/20/2016: Received from: External Pharmacy   icosapent Ethyl (VASCEPA) 1 g capsule Take 2 capsules by mouth 2 (two) times daily.    lisinopril (ZESTRIL) 20 MG tablet TAKE 1 TABLET BY MOUTH ONCE DAILY AND ONE-HALF TABLET  BY MOUTH AT NIGHT    metoprolol tartrate (LOPRESSOR) 25 MG tablet Take 1 tablet by mouth 2 (two) times daily.    Multiple Vitamins-Minerals (MULTIVITAMIN ADULT PO) Take by mouth. 02/20/2016: Received from: [facility]   nitroGLYCERIN (NITROSTAT) 0.4 MG SL tablet  02/20/2016: Received from: External Pharmacy   pantoprazole (PROTONIX) 20 MG tablet Take 20 mg by mouth 2 (two) times daily.    No facility-administered encounter medications on file as of 02/13/2023.    Surgical History: Past Surgical History:  Procedure Laterality Date   COLONOSCOPY WITH PROPOFOL N/A 04/18/2020   Procedure: COLONOSCOPY WITH PROPOFOL;  Surgeon: Regis Bill, MD;  Location: [facility] ENDOSCOPY;  Service: Endoscopy;  Laterality: N/A;   CORONARY ANGIOPLASTY      ESOPHAGOGASTRODUODENOSCOPY N/A 06/19/2020   Procedure: ESOPHAGOGASTRODUODENOSCOPY (EGD);  Surgeon: Regis Bill, MD;  Location: [facility] ENDOSCOPY;  Service: Endoscopy;  Laterality: N/A;   ESOPHAGOGASTRODUODENOSCOPY (EGD) WITH PROPOFOL N/A 04/18/2020   Procedure: ESOPHAGOGASTRODUODENOSCOPY (EGD) WITH PROPOFOL;  Surgeon: Regis Bill, MD;  Location: [facility] ENDOSCOPY;  Service: Endoscopy;  Laterality: N/A;   EXPLORATION POST OPERATIVE OPEN HEART     HERNIA REPAIR     SINUS SURGERY WITH INSTATRAK      Medical History: Past Medical History:  Diagnosis Date   Hyperlipidemia    Hypertension    Myocardial infarction [facility])     Family History: Family History  Problem Relation Age of Onset   Stroke Father     Social History   Socioeconomic History   Marital status: Married    Spouse name: Not on file   Number of children: Not on file   Years of education: Not on file   Highest education level: Not on file  Occupational History   Not on file  Tobacco Use   Smoking status: Former   Smokeless tobacco: Current    Types: Chew  Vaping Use   Vaping Use: Never used  Substance and Sexual Activity   Alcohol use: Never    Alcohol/week: 0.0 standard drinks of alcohol   Drug use: Never   Sexual activity: Not on file  Other Topics Concern   Not on file  Social History Narrative   Not on file   Social Determinants of Health   Financial Resource Strain: Not on file  Food Insecurity: Not on file  Transportation Needs: Not on file  Physical Activity: Not on file  Stress: Not on file  Social Connections: Not on file  Intimate Partner Violence: Not on file      Review of Systems  Constitutional:  Negative for activity change, appetite change, chills, fatigue, fever and unexpected weight change.  HENT: Negative.  Negative for congestion, ear pain, rhinorrhea, sore throat and trouble swallowing.   Eyes: Negative.   Respiratory: Negative.  Negative for cough, chest tightness,  shortness of breath and wheezing.   Cardiovascular: Negative.  Negative for chest pain.  Gastrointestinal: Negative.  Negative for abdominal pain, blood in stool, constipation, diarrhea, nausea and vomiting.  Endocrine: Negative.   Genitourinary: Negative.  Negative for difficulty urinating, dysuria, frequency, hematuria and urgency.  Musculoskeletal: Negative.  Negative for arthralgias, back pain, joint swelling, myalgias and neck pain.  Skin: Negative.  Negative for rash and wound.  Allergic/Immunologic: Negative.  Negative for immunocompromised state.  Neurological: Negative.  Negative for dizziness, seizures, numbness and headaches.  Hematological: Negative.   Psychiatric/Behavioral: Negative.  Negative for behavioral problems, self-injury and suicidal ideas. The patient is not nervous/anxious.     Vital Signs: BP 135/80   Pulse 76   Temp 98.2 F (36.8 C)   Resp 16   Ht 5\' 11"  (1.803 m)   Wt 276 lb 3.2 oz (125.3 kg)   SpO2 96%   BMI 38.52 kg/m    Physical Exam Vitals reviewed.  Constitutional:      General: He is awake. He is not in acute distress.    Appearance: Normal appearance. He is well-developed and well-groomed. He is obese. He is not ill-appearing or diaphoretic.  HENT:     Head: Normocephalic and atraumatic.     Right Ear: Tympanic membrane, ear canal and external ear normal.     Left Ear: Tympanic membrane, ear canal and external ear normal.     Nose: Nose normal. No congestion or rhinorrhea.     Mouth/Throat:     Lips: Pink.     Mouth: Mucous membranes are moist.     Pharynx: Oropharynx is clear. Uvula midline. No oropharyngeal exudate or posterior oropharyngeal erythema.  Eyes:     General: Lids are normal. Vision grossly intact. Gaze aligned appropriately. No scleral icterus.       Right eye: No discharge.        Left eye: No discharge.     Extraocular Movements: Extraocular movements intact.     Conjunctiva/sclera: Conjunctivae normal.     Pupils:  Pupils are equal, round, and reactive to light.     Funduscopic exam:    Right eye: Red reflex present.        Left eye: Red reflex present. Neck:     Thyroid: No thyromegaly.     Vascular: No JVD.     Trachea: Trachea and phonation normal. No tracheal deviation.  Cardiovascular:     Rate and Rhythm: Normal rate and regular rhythm.     Pulses: Normal pulses.     Heart sounds: Normal heart sounds, S1 normal and S2 normal. No murmur heard.    No friction rub. No gallop.  Pulmonary:     Effort: Pulmonary effort is normal. No accessory muscle usage or respiratory distress.     Breath sounds: Normal breath sounds and air entry. No stridor. No wheezing or rales.  Chest:     Chest wall: No tenderness.  Abdominal:     General: Bowel sounds are normal. There is  no distension.     Palpations: Abdomen is soft. There is no shifting dullness, fluid wave, mass or pulsatile mass.     Tenderness: There is no abdominal tenderness. There is no guarding or rebound.  Musculoskeletal:        General: No tenderness or deformity. Normal range of motion.     Cervical back: Normal range of motion and neck supple.     Right lower leg: No edema.     Left lower leg: No edema.  Lymphadenopathy:     Cervical: No cervical adenopathy.  Skin:    General: Skin is warm and dry.     Capillary Refill: Capillary refill takes less than 2 seconds.     Coloration: Skin is not pale.     Findings: No erythema or rash.  Neurological:     Mental Status: He is alert and oriented to person, place, and time.     Cranial Nerves: No cranial nerve deficit.     Motor: No abnormal muscle tone.     Coordination: Coordination normal.     Gait: Gait normal.     Deep Tendon Reflexes: Reflexes are normal and symmetric.  Psychiatric:        Mood and Affect: Mood normal.        Behavior: Behavior normal. Behavior is cooperative.        Thought Content: Thought content normal.        Judgment: Judgment normal.         Assessment/Plan: 1. Encounter for general adult medical examination with abnormal findings Age-appropriate preventive screenings and vaccinations discussed, annual physical exam completed. Routine labs for health maintenance ordered and patient given a written order to take to [facility]. PHM updated.   2. Prediabetes Gave patient an order for labs to have done at [facility] clinic  3. Dyslipidemia Stable, continue medications as prescribed  4. Primary hypertension Stable, continue medications as prescribed  5. Dysuria Routine urinalysis done  - UA/M w/rflx Culture, Routine      General Counseling: Gar verbalizes understanding of the findings of todays visit and agrees with plan of treatment. I have discussed any further diagnostic evaluation that may be needed or ordered today. We also reviewed his medications today. he has been encouraged to call the office with any questions or concerns that should arise related to todays visit.    Orders Placed This Encounter  Procedures   UA/M w/rflx Culture, Routine    No orders of the defined types were placed in this encounter.   Return in about 1 year (around 02/13/2024) for CPE, AWV, [facility] PCP and otherwise as needed. .   Total time spent:30 Minutes Time spent includes review of chart, medications, test results, and follow up plan with the patient.   [place] Controlled Substance Database was reviewed by me.  This patient was seen by Sallyanne Kuster, FNP-C in collaboration with Dr. Beverely Risen as a part of collaborative care agreement.  Lexys Milliner R. Tedd Sias, MSN, FNP-C Internal medicine

## 2023-05-19 DIAGNOSIS — M9905 Segmental and somatic dysfunction of pelvic region: Secondary | ICD-10-CM | POA: Diagnosis not present

## 2023-05-19 DIAGNOSIS — M9903 Segmental and somatic dysfunction of lumbar region: Secondary | ICD-10-CM | POA: Diagnosis not present

## 2023-05-19 DIAGNOSIS — M9902 Segmental and somatic dysfunction of thoracic region: Secondary | ICD-10-CM | POA: Diagnosis not present

## 2023-05-19 DIAGNOSIS — M5441 Lumbago with sciatica, right side: Secondary | ICD-10-CM | POA: Diagnosis not present

## 2023-05-26 DIAGNOSIS — M5441 Lumbago with sciatica, right side: Secondary | ICD-10-CM | POA: Diagnosis not present

## 2023-05-26 DIAGNOSIS — M9903 Segmental and somatic dysfunction of lumbar region: Secondary | ICD-10-CM | POA: Diagnosis not present

## 2023-05-26 DIAGNOSIS — M9905 Segmental and somatic dysfunction of pelvic region: Secondary | ICD-10-CM | POA: Diagnosis not present

## 2023-05-26 DIAGNOSIS — M9902 Segmental and somatic dysfunction of thoracic region: Secondary | ICD-10-CM | POA: Diagnosis not present

## 2023-06-02 DIAGNOSIS — M9903 Segmental and somatic dysfunction of lumbar region: Secondary | ICD-10-CM | POA: Diagnosis not present

## 2023-06-02 DIAGNOSIS — M5441 Lumbago with sciatica, right side: Secondary | ICD-10-CM | POA: Diagnosis not present

## 2023-06-02 DIAGNOSIS — M9905 Segmental and somatic dysfunction of pelvic region: Secondary | ICD-10-CM | POA: Diagnosis not present

## 2023-06-02 DIAGNOSIS — M9902 Segmental and somatic dysfunction of thoracic region: Secondary | ICD-10-CM | POA: Diagnosis not present

## 2023-06-04 ENCOUNTER — Ambulatory Visit: Payer: Medicare PPO | Admitting: Dermatology

## 2023-06-13 DIAGNOSIS — I25119 Atherosclerotic heart disease of native coronary artery with unspecified angina pectoris: Secondary | ICD-10-CM | POA: Diagnosis not present

## 2023-06-13 DIAGNOSIS — E785 Hyperlipidemia, unspecified: Secondary | ICD-10-CM | POA: Diagnosis not present

## 2023-08-25 ENCOUNTER — Encounter: Payer: Self-pay | Admitting: Physician Assistant

## 2023-08-25 ENCOUNTER — Telehealth (INDEPENDENT_AMBULATORY_CARE_PROVIDER_SITE_OTHER): Payer: Medicare PPO | Admitting: Physician Assistant

## 2023-08-25 VITALS — Ht 71.0 in | Wt 246.0 lb

## 2023-08-25 DIAGNOSIS — J069 Acute upper respiratory infection, unspecified: Secondary | ICD-10-CM | POA: Diagnosis not present

## 2023-08-25 MED ORDER — DEXTROMETHORPHAN POLISTIREX ER 30 MG/5ML PO SUER
60.0000 mg | Freq: Two times a day (BID) | ORAL | 0 refills | Status: DC | PRN
Start: 2023-08-25 — End: 2024-02-16

## 2023-08-25 MED ORDER — AZITHROMYCIN 250 MG PO TABS
ORAL_TABLET | ORAL | 0 refills | Status: AC
Start: 2023-08-25 — End: 2023-08-30

## 2023-08-25 NOTE — Progress Notes (Signed)
[facility] [address] [place], [place] [address]  Internal MEDICINE  Telephone Visit  Patient Name: Lee Douglas  [id number]  [id number]  Date of Service: 08/25/2023  I connected with the patient at 11:50 by telephone and verified the patients identity using two identifiers.   I discussed the limitations, risks, security and privacy concerns of performing an evaluation and management service by telephone and the availability of in person appointments. I also discussed with the patient that there may be a patient responsible charge related to the service.  The patient expressed understanding and agrees to proceed.    Chief Complaint  Patient presents with   Telephone Assessment    Telephone [phone]   Telephone Screen    Covid test is negative    Cough   Sinusitis   Nasal Congestion    HPI Pt is here for virtual sick visit -started not feeling well about 2 weeks ago, not getting any better -Coughing, sinus congestion -No fever/chills, SOB, or wheezing -has been taking mucinex and nyquil, zycam  Current Medication: Outpatient Encounter Medications as of 08/25/2023  Medication Sig Note   aspirin 81 MG tablet Take by mouth. 02/20/2016: Received from: [facility]   atorvastatin (LIPITOR) 80 MG tablet  02/20/2016: Received from: External Pharmacy   azithromycin (ZITHROMAX) 250 MG tablet Take 2 tablets on day 1, then 1 tablet daily on days 2 through 5    clopidogrel (PLAVIX) 75 MG tablet  02/20/2016: Received from: External Pharmacy   dextromethorphan (DELSYM) 30 MG/5ML liquid Take 10 mLs (60 mg total) by mouth 2 (two) times daily as needed for cough.    icosapent Ethyl (VASCEPA) 1 g capsule Take 2 capsules by mouth 2 (two) times daily.    lisinopril (ZESTRIL) 20 MG tablet TAKE 1 TABLET BY MOUTH ONCE DAILY AND ONE-HALF TABLET  BY MOUTH AT NIGHT    metoprolol tartrate (LOPRESSOR) 25 MG tablet Take 1 tablet by mouth 2 (two) times daily.    Multiple  Vitamins-Minerals (MULTIVITAMIN ADULT PO) Take by mouth. 02/20/2016: Received from: [facility]   nitroGLYCERIN (NITROSTAT) 0.4 MG SL tablet  02/20/2016: Received from: External Pharmacy   pantoprazole (PROTONIX) 20 MG tablet Take 20 mg by mouth 2 (two) times daily.    No facility-administered encounter medications on file as of 08/25/2023.    Surgical History: Past Surgical History:  Procedure Laterality Date   COLONOSCOPY WITH PROPOFOL N/A 04/18/2020   Procedure: COLONOSCOPY WITH PROPOFOL;  Surgeon: Regis Bill, MD;  Location: [facility] ENDOSCOPY;  Service: Endoscopy;  Laterality: N/A;   CORONARY ANGIOPLASTY     ESOPHAGOGASTRODUODENOSCOPY N/A 06/19/2020   Procedure: ESOPHAGOGASTRODUODENOSCOPY (EGD);  Surgeon: Regis Bill, MD;  Location: [facility] ENDOSCOPY;  Service: Endoscopy;  Laterality: N/A;   ESOPHAGOGASTRODUODENOSCOPY (EGD) WITH PROPOFOL N/A 04/18/2020   Procedure: ESOPHAGOGASTRODUODENOSCOPY (EGD) WITH PROPOFOL;  Surgeon: Regis Bill, MD;  Location: [facility] ENDOSCOPY;  Service: Endoscopy;  Laterality: N/A;   EXPLORATION POST OPERATIVE OPEN HEART     HERNIA REPAIR     SINUS SURGERY WITH INSTATRAK      Medical History: Past Medical History:  Diagnosis Date   Hyperlipidemia    Hypertension    Myocardial infarction [facility])     Family History: Family History  Problem Relation Age of Onset   Stroke Father     Social History   Socioeconomic History   Marital status: Married    Spouse name: Not on file   Number of children: Not  on file   Years of education: Not on file   Highest education level: Not on file  Occupational History   Not on file  Tobacco Use   Smoking status: Former   Smokeless tobacco: Current    Types: Chew  Vaping Use   Vaping status: Never Used  Substance and Sexual Activity   Alcohol use: Never    Alcohol/week: 0.0 standard drinks of alcohol   Drug use: Never   Sexual activity: Not on file  Other Topics Concern   Not  on file  Social History Narrative   Not on file   Social Drivers of Health   Financial Resource Strain: Not on file  Food Insecurity: Not on file  Transportation Needs: Not on file  Physical Activity: Not on file  Stress: Not on file  Social Connections: Not on file  Intimate Partner Violence: Not on file      Review of Systems  Constitutional:  Negative for chills, fatigue and fever.  HENT:  Positive for congestion and postnasal drip. Negative for mouth sores.   Respiratory:  Positive for cough. Negative for shortness of breath and wheezing.   Cardiovascular:  Negative for chest pain.  Genitourinary:  Negative for flank pain.  Psychiatric/Behavioral: Negative.      Vital Signs: Ht 5\' 11"  (1.803 m)   Wt 246 lb (111.6 kg)   BMI 34.31 kg/m    Observation/Objective:  Pt is able to carry out conversation   Assessment/Plan: 1. Upper respiratory tract infection, unspecified type (Primary) Will send zpak and cough syrup - azithromycin (ZITHROMAX) 250 MG tablet; Take 2 tablets on day 1, then 1 tablet daily on days 2 through 5  Dispense: 6 tablet; Refill: 0 - dextromethorphan (DELSYM) 30 MG/5ML liquid; Take 10 mLs (60 mg total) by mouth 2 (two) times daily as needed for cough.  Dispense: 89 mL; Refill: 0   General Counseling: Kimon verbalizes understanding of the findings of today's phone visit and agrees with plan of treatment. I have discussed any further diagnostic evaluation that may be needed or ordered today. We also reviewed his medications today. he has been encouraged to call the office with any questions or concerns that should arise related to todays visit.    No orders of the defined types were placed in this encounter.   Meds ordered this encounter  Medications   azithromycin (ZITHROMAX) 250 MG tablet    Sig: Take 2 tablets on day 1, then 1 tablet daily on days 2 through 5    Dispense:  6 tablet    Refill:  0   dextromethorphan (DELSYM) 30 MG/5ML liquid     Sig: Take 10 mLs (60 mg total) by mouth 2 (two) times daily as needed for cough.    Dispense:  89 mL    Refill:  0    Time spent:25 Minutes    Dr Lyndon Code Internal medicine

## 2023-09-11 DIAGNOSIS — K200 Eosinophilic esophagitis: Secondary | ICD-10-CM | POA: Diagnosis not present

## 2023-09-11 DIAGNOSIS — I2510 Atherosclerotic heart disease of native coronary artery without angina pectoris: Secondary | ICD-10-CM | POA: Diagnosis not present

## 2023-09-11 DIAGNOSIS — Z860101 Personal history of adenomatous and serrated colon polyps: Secondary | ICD-10-CM | POA: Diagnosis not present

## 2023-11-18 ENCOUNTER — Encounter: Payer: Self-pay | Admitting: *Deleted

## 2023-11-20 ENCOUNTER — Encounter: Payer: Self-pay | Admitting: *Deleted

## 2023-11-24 ENCOUNTER — Encounter: Payer: Self-pay | Admitting: *Deleted

## 2023-12-01 ENCOUNTER — Encounter
Admission: RE | Disposition: A | Discharge status: Home / Self Care | Payer: Self-pay | Source: Ambulatory Visit | Attending: Gastroenterology

## 2023-12-01 ENCOUNTER — Encounter: Payer: Self-pay | Admitting: *Deleted

## 2023-12-01 ENCOUNTER — Ambulatory Visit: Admitting: Anesthesiology

## 2023-12-01 ENCOUNTER — Ambulatory Visit
Admission: RE | Admit: 2023-12-01 | Discharge: 2023-12-01 | Disposition: A | Discharge status: Home / Self Care | Payer: Medicare PPO | Source: Ambulatory Visit | Attending: Gastroenterology | Admitting: Gastroenterology

## 2023-12-01 DIAGNOSIS — Z79899 Other long term (current) drug therapy: Secondary | ICD-10-CM | POA: Diagnosis not present

## 2023-12-01 DIAGNOSIS — Z6838 Body mass index (BMI) 38.0-38.9, adult: Secondary | ICD-10-CM | POA: Insufficient documentation

## 2023-12-01 DIAGNOSIS — Z7902 Long term (current) use of antithrombotics/antiplatelets: Secondary | ICD-10-CM | POA: Insufficient documentation

## 2023-12-01 DIAGNOSIS — I252 Old myocardial infarction: Secondary | ICD-10-CM | POA: Diagnosis not present

## 2023-12-01 DIAGNOSIS — Z1211 Encounter for screening for malignant neoplasm of colon: Secondary | ICD-10-CM | POA: Insufficient documentation

## 2023-12-01 DIAGNOSIS — K640 First degree hemorrhoids: Secondary | ICD-10-CM | POA: Diagnosis not present

## 2023-12-01 DIAGNOSIS — E669 Obesity, unspecified: Secondary | ICD-10-CM | POA: Diagnosis not present

## 2023-12-01 DIAGNOSIS — Z87891 Personal history of nicotine dependence: Secondary | ICD-10-CM | POA: Insufficient documentation

## 2023-12-01 DIAGNOSIS — I10 Essential (primary) hypertension: Secondary | ICD-10-CM | POA: Diagnosis not present

## 2023-12-01 DIAGNOSIS — Z860101 Personal history of adenomatous and serrated colon polyps: Secondary | ICD-10-CM | POA: Diagnosis not present

## 2023-12-01 DIAGNOSIS — K5730 Diverticulosis of large intestine without perforation or abscess without bleeding: Secondary | ICD-10-CM | POA: Diagnosis not present

## 2023-12-01 DIAGNOSIS — D122 Benign neoplasm of ascending colon: Secondary | ICD-10-CM | POA: Insufficient documentation

## 2023-12-01 DIAGNOSIS — D123 Benign neoplasm of transverse colon: Secondary | ICD-10-CM | POA: Insufficient documentation

## 2023-12-01 DIAGNOSIS — K635 Polyp of colon: Secondary | ICD-10-CM | POA: Diagnosis not present

## 2023-12-01 DIAGNOSIS — I2510 Atherosclerotic heart disease of native coronary artery without angina pectoris: Secondary | ICD-10-CM | POA: Diagnosis not present

## 2023-12-01 DIAGNOSIS — K649 Unspecified hemorrhoids: Secondary | ICD-10-CM | POA: Diagnosis not present

## 2023-12-01 HISTORY — DX: Atherosclerotic heart disease of native coronary artery without angina pectoris: I25.10

## 2023-12-01 HISTORY — PX: COLONOSCOPY WITH PROPOFOL: SHX5780

## 2023-12-01 HISTORY — PX: POLYPECTOMY: SHX149

## 2023-12-01 SURGERY — COLONOSCOPY WITH PROPOFOL
Anesthesia: General

## 2023-12-01 MED ORDER — SODIUM CHLORIDE 0.9 % IV SOLN
INTRAVENOUS | Status: DC
  Administered 2023-12-01: 1000 mL via INTRAVENOUS

## 2023-12-01 MED ORDER — PROPOFOL 1000 MG/100ML IV EMUL
INTRAVENOUS | Status: AC
  Filled 2023-12-01: qty 100

## 2023-12-01 MED ORDER — PROPOFOL 10 MG/ML IV BOLUS
INTRAVENOUS | Status: DC | PRN
  Administered 2023-12-01: 100 mg via INTRAVENOUS

## 2023-12-01 MED ORDER — PROPOFOL 500 MG/50ML IV EMUL
INTRAVENOUS | Status: DC | PRN
  Administered 2023-12-01: 150 ug/kg/min via INTRAVENOUS

## 2023-12-01 NOTE — Anesthesia Preprocedure Evaluation (Addendum)
 Anesthesia Evaluation  Patient identified by MRN, date of birth, ID band Patient awake    Reviewed: Allergy & Precautions, H&P , NPO status , Patient's Chart, lab work & pertinent test results  Airway Mallampati: III  TM Distance: >3 FB Neck ROM: full    Dental  (+) Implants, Missing   Pulmonary former smoker   Pulmonary exam normal        Cardiovascular Exercise Tolerance: Good hypertension, Pt. on home beta blockers and Pt. on medications + CAD and + Past MI  Normal cardiovascular exam     Neuro/Psych negative neurological ROS  negative psych ROS   GI/Hepatic negative GI ROS, Neg liver ROS,,,  Endo/Other  negative endocrine ROS    Renal/GU negative Renal ROS  negative genitourinary   Musculoskeletal   Abdominal  (+) + obese  Peds  Hematology negative hematology ROS (+)   Anesthesia Other Findings Past Medical History: No date: Coronary artery disease No date: Hyperlipidemia No date: Hypertension No date: Myocardial infarction [facility])  Past Surgical History: 04/18/2020: COLONOSCOPY WITH PROPOFOL; N/A     Comment:  Procedure: COLONOSCOPY WITH PROPOFOL;  Surgeon:               Regis Bill, MD;  Location: [facility] ENDOSCOPY;                Service: Endoscopy;  Laterality: N/A; No date: CORONARY ANGIOPLASTY 06/19/2020: ESOPHAGOGASTRODUODENOSCOPY; N/A     Comment:  Procedure: ESOPHAGOGASTRODUODENOSCOPY (EGD);  Surgeon:               Regis Bill, MD;  Location: [facility] ENDOSCOPY;                Service: Endoscopy;  Laterality: N/A; 04/18/2020: ESOPHAGOGASTRODUODENOSCOPY (EGD) WITH PROPOFOL; N/A     Comment:  Procedure: ESOPHAGOGASTRODUODENOSCOPY (EGD) WITH               PROPOFOL;  Surgeon: Regis Bill, MD;  Location:               [facility] ENDOSCOPY;  Service: Endoscopy;  Laterality: N/A; No date: EXPLORATION POST OPERATIVE OPEN HEART No date: HERNIA REPAIR No date: SINUS SURGERY WITH  INSTATRAK     Reproductive/Obstetrics negative OB ROS                             Anesthesia Physical Anesthesia Plan  ASA: 3  Anesthesia Plan: General   Post-op Pain Management:    Induction:   PONV Risk Score and Plan: Propofol infusion and TIVA  Airway Management Planned: Natural Airway  Additional Equipment:   Intra-op Plan:   Post-operative Plan:   Informed Consent: I have reviewed the patients History and Physical, chart, labs and discussed the procedure including the risks, benefits and alternatives for the proposed anesthesia with the patient or authorized representative who has indicated his/her understanding and acceptance.     Dental Advisory Given  Plan Discussed with: CRNA and Surgeon  Anesthesia Plan Comments:         Anesthesia Quick Evaluation

## 2023-12-01 NOTE — Op Note (Signed)
 [facility] Patient Name: Lee Douglas Procedure Date: 12/01/2023 11:15 AM MRN: [id number] Account #: [id number] Date of Birth: 1956/03/25 Admit Type: Outpatient Age: 68 Room: [facility] ENDO ROOM 3 Gender: Male Note Status: Finalized Instrument Name: Prentice Docker [id number] Procedure:             Colonoscopy Indications:           Surveillance: Personal history of adenomatous polyps                         on last colonoscopy > 3 years ago Providers:             Eather Colas MD, MD Referring MD:          Sallyanne Kuster (Referring MD) Medicines:             Monitored Anesthesia Care Complications:         No immediate complications. Estimated blood loss:                         Minimal. Procedure:             Pre-Anesthesia Assessment:                        - Prior to the procedure, a History and Physical was                         performed, and patient medications and allergies were                         reviewed. The patient is competent. The risks and                         benefits of the procedure and the sedation options and                         risks were discussed with the patient. All questions                         were answered and informed consent was obtained.                         Patient identification and proposed procedure were                         verified by the physician, the nurse, the                         anesthesiologist, the anesthetist and the technician                         in the endoscopy suite. Mental Status Examination:                         alert and oriented. Airway Examination: normal                         oropharyngeal airway and neck mobility. Respiratory  Examination: clear to auscultation. CV Examination:                         normal. Prophylactic Antibiotics: The patient does not                         require prophylactic antibiotics. Prior                          Anticoagulants: The patient has taken Plavix                         (clopidogrel), last dose was 7 days prior to                         procedure. ASA Grade Assessment: II - A patient with                         mild systemic disease. After reviewing the risks and                         benefits, the patient was deemed in satisfactory                         condition to undergo the procedure. The anesthesia                         plan was to use monitored anesthesia care (MAC).                         Immediately prior to administration of medications,                         the patient was re-assessed for adequacy to receive                         sedatives. The heart rate, respiratory rate, oxygen                         saturations, blood pressure, adequacy of pulmonary                         ventilation, and response to care were monitored                         throughout the procedure. The physical status of the                         patient was re-assessed after the procedure.                        After obtaining informed consent, the colonoscope was                         passed under direct vision. Throughout the procedure,                         the patient's blood pressure, pulse, and oxygen  saturations were monitored continuously. The                         Colonoscope was introduced through the anus and                         advanced to the the cecum, identified by appendiceal                         orifice and ileocecal valve. The colonoscopy was                         performed without difficulty. The patient tolerated                         the procedure well. The quality of the bowel                         preparation was good. The ileocecal valve, appendiceal                         orifice, and rectum were photographed. Findings:      The perianal and digital rectal examinations were normal.      A 2 mm polyp was found in  the ascending colon. The polyp was sessile.       The polyp was removed with a jumbo cold forceps. Resection and retrieval       were complete. Estimated blood loss was minimal.      Scattered large-mouthed and small-mouthed diverticula were found in the       sigmoid colon, descending colon, transverse colon and ascending colon.      Two sessile polyps were found in the transverse colon. The polyps were 2       to 3 mm in size. These polyps were removed with a cold snare. Resection       and retrieval were complete. Estimated blood loss was minimal.      Internal hemorrhoids were found during retroflexion. The hemorrhoids       were Grade I (internal hemorrhoids that do not prolapse).      The exam was otherwise without abnormality on direct and retroflexion       views. Impression:            - One 2 mm polyp in the ascending colon, removed with                         a jumbo cold forceps. Resected and retrieved.                        - Diverticulosis in the sigmoid colon, in the                         descending colon, in the transverse colon and in the                         ascending colon.                        - Two 2 to 3 mm polyps in the transverse colon,  removed with a cold snare. Resected and retrieved.                        - Internal hemorrhoids.                        - The examination was otherwise normal on direct and                         retroflexion views. Recommendation:        - Discharge patient to home.                        - Resume previous diet.                        - Continue present medications.                        - Resume Plavix (clopidogrel) at prior dose tomorrow.                        - Await pathology results.                        - Repeat colonoscopy in 5 years for surveillance.                        - Return to referring physician as previously                         scheduled. Procedure Code(s):     ---  Professional ---                        [phone], Colonoscopy, flexible; with removal of                         tumor(s), polyp(s), or other lesion(s) by snare                         technique                        45380, 59, Colonoscopy, flexible; with biopsy, single                         or multiple Diagnosis Code(s):     --- Professional ---                        Z86.010, Personal history of colonic polyps                        D12.2, Benign neoplasm of ascending colon                        K64.0, First degree hemorrhoids                        D12.3, Benign neoplasm of transverse colon (hepatic  flexure or splenic flexure)                        K57.30, Diverticulosis of large intestine without                         perforation or abscess without bleeding CPT copyright 2022 American Medical Association. All rights reserved. The codes documented in this report are preliminary and upon coder review may  be revised to meet current compliance requirements. Eather Colas MD, MD 12/01/2023 11:55:19 AM Number of Addenda: 0 Note Initiated On: 12/01/2023 11:15 AM Scope Withdrawal Time: 0 hours 11 minutes 0 seconds  Total Procedure Duration: 0 hours 16 minutes 30 seconds  Estimated Blood Loss:  Estimated blood loss was minimal.      [facility]

## 2023-12-01 NOTE — Interval H&P Note (Signed)
 History and Physical Interval Note:  12/01/2023 11:19 AM  Lee Douglas  has presented today for surgery, with the diagnosis of PH Colon Polyps.  The various methods of treatment have been discussed with the patient and family. After consideration of risks, benefits and other options for treatment, the patient has consented to  Procedure(s): COLONOSCOPY WITH PROPOFOL (N/A) as a surgical intervention.  The patient's history has been reviewed, patient examined, no change in status, stable for surgery.  I have reviewed the patient's chart and labs.  Questions were answered to the patient's satisfaction.     Regis Bill  Ok to proceed with colonoscopy

## 2023-12-01 NOTE — Transfer of Care (Signed)
 Immediate Anesthesia Transfer of Care Note  Patient: Lee Douglas  Procedure(s) Performed: COLONOSCOPY WITH PROPOFOL  Patient Location: PACU  Anesthesia Type:General  Level of Consciousness: awake and sedated  Airway & Oxygen Therapy: Patient Spontanous Breathing and Patient connected to nasal cannula oxygen  Post-op Assessment: Report given to RN and Post -op Vital signs reviewed and stable  Post vital signs: Reviewed and stable  Last Vitals:  Vitals Value Taken Time  BP    Temp    Pulse    Resp    SpO2      Last Pain:  Vitals:   12/01/23 1048  TempSrc: Temporal  PainSc: 0-No pain         Complications: There were no known notable events for this encounter.

## 2023-12-01 NOTE — H&P (Signed)
 Outpatient short stay form Pre-procedure 12/01/2023  Lee Bill, MD  Primary Physician: Sallyanne Kuster, NP  Reason for visit:  Surveillance  History of present illness:    68 y/o gentleman with history of CAD on plavix (last dose 7 days ago), EoE, and hypertension here for surveillance colonoscopy. Last colonoscopy in 2021 with small TVA. History of inguinal hernia repair. No family history of GI malignancies.    Current Facility-Administered Medications:    0.9 %  sodium chloride infusion, , Intravenous, Continuous, Azarria Balint, Rossie Muskrat, MD, Last Rate: 20 mL/hr at 12/01/23 1103, 1,000 mL at 12/01/23 1103  Medications Prior to Admission  Medication Sig Dispense Refill Last Dose/Taking   aspirin 81 MG tablet Take by mouth.   11/30/2023 Morning   atorvastatin (LIPITOR) 80 MG tablet    11/30/2023 Evening   lisinopril (ZESTRIL) 20 MG tablet TAKE 1 TABLET BY MOUTH ONCE DAILY AND ONE-HALF TABLET  BY MOUTH AT NIGHT   12/01/2023 Morning   metoprolol tartrate (LOPRESSOR) 25 MG tablet Take 1 tablet by mouth 2 (two) times daily.   12/01/2023 Morning   clopidogrel (PLAVIX) 75 MG tablet    11/24/2023   dextromethorphan (DELSYM) 30 MG/5ML liquid Take 10 mLs (60 mg total) by mouth 2 (two) times daily as needed for cough. (Patient not taking: Reported on 12/01/2023) 89 mL 0 Not Taking   icosapent Ethyl (VASCEPA) 1 g capsule Take 2 capsules by mouth 2 (two) times daily.      Multiple Vitamins-Minerals (MULTIVITAMIN ADULT PO) Take by mouth.      nitroGLYCERIN (NITROSTAT) 0.4 MG SL tablet       pantoprazole (PROTONIX) 20 MG tablet Take 20 mg by mouth 2 (two) times daily.        Allergies  Allergen Reactions   Codeine Nausea Only     Past Medical History:  Diagnosis Date   Coronary artery disease    Hyperlipidemia    Hypertension    Myocardial infarction [facility])     Review of systems:  Otherwise negative.    Physical Exam  Gen: Alert, oriented. Appears stated age.  HEENT:   PERRLA. Lungs: No respiratory distress CV: RRR Abd: soft, benign, no masses Ext: No edema    Planned procedures: Proceed with colonoscopy. The patient understands the nature of the planned procedure, indications, risks, alternatives and potential complications including but not limited to bleeding, infection, perforation, damage to internal organs and possible oversedation/side effects from anesthesia. The patient agrees and gives consent to proceed.  Please refer to procedure notes for findings, recommendations and patient disposition/instructions.     Lee Bill, MD [facility]

## 2023-12-02 ENCOUNTER — Encounter: Payer: Self-pay | Admitting: Gastroenterology

## 2023-12-02 LAB — SURGICAL PATHOLOGY

## 2023-12-02 NOTE — Anesthesia Postprocedure Evaluation (Signed)
 Anesthesia Post Note  Patient: Lee Douglas  Procedure(s) Performed: COLONOSCOPY WITH PROPOFOL POLYPECTOMY, INTESTINE  Patient location during evaluation: PACU Anesthesia Type: General Level of consciousness: awake and alert Pain management: pain level controlled Vital Signs Assessment: post-procedure vital signs reviewed and stable Respiratory status: spontaneous breathing, nonlabored ventilation and respiratory function stable Cardiovascular status: blood pressure returned to baseline and stable Postop Assessment: no apparent nausea or vomiting Anesthetic complications: no   There were no known notable events for this encounter.   Last Vitals:  Vitals:   12/01/23 1203 12/01/23 1215  BP: 113/76 128/79  Pulse: 66 60  Resp: 18 16  Temp:    SpO2: 95% 98%    Last Pain:  Vitals:   12/01/23 1215  TempSrc:   PainSc: 0-No pain                 Foye Deer

## 2023-12-30 ENCOUNTER — Ambulatory Visit
Admission: RE | Admit: 2023-12-30 | Discharge: 2023-12-30 | Disposition: A | Discharge status: Home / Self Care | Attending: Nurse Practitioner | Admitting: Nurse Practitioner

## 2023-12-30 ENCOUNTER — Ambulatory Visit
Admission: RE | Admit: 2023-12-30 | Discharge: 2023-12-30 | Disposition: A | Discharge status: Home / Self Care | Source: Ambulatory Visit | Attending: Nurse Practitioner | Admitting: Nurse Practitioner

## 2023-12-30 ENCOUNTER — Ambulatory Visit: Admitting: Nurse Practitioner

## 2023-12-30 ENCOUNTER — Encounter: Payer: Self-pay | Admitting: Nurse Practitioner

## 2023-12-30 VITALS — BP 140/80 | HR 62 | Temp 98.2°F | Resp 16 | Ht 71.0 in | Wt 275.4 lb

## 2023-12-30 DIAGNOSIS — R062 Wheezing: Secondary | ICD-10-CM | POA: Diagnosis not present

## 2023-12-30 DIAGNOSIS — R0989 Other specified symptoms and signs involving the circulatory and respiratory systems: Secondary | ICD-10-CM | POA: Diagnosis not present

## 2023-12-30 DIAGNOSIS — J189 Pneumonia, unspecified organism: Secondary | ICD-10-CM | POA: Diagnosis not present

## 2023-12-30 MED ORDER — HYDROCOD POLI-CHLORPHE POLI ER 10-8 MG/5ML PO SUER: 5.0000 mL | Freq: Two times a day (BID) | ORAL | 0 refills | Status: DC | PRN

## 2023-12-30 MED ORDER — AZITHROMYCIN 250 MG PO TABS: ORAL_TABLET | ORAL | 0 refills | Status: AC

## 2023-12-30 MED ORDER — PREDNISONE 10 MG (21) PO TBPK: ORAL_TABLET | ORAL | 0 refills | Status: DC

## 2023-12-30 NOTE — Progress Notes (Signed)
 [facility] [address] [place], [place] [address]  Internal MEDICINE  Office Visit Note  Patient Name: Lee Douglas  [id number]  [id number]  Date of Service: 12/30/2023  Chief Complaint  Patient presents with   Acute Visit    Cold like symptoms - started over a week ago.   Nasal Congestion   Cough     HPI Lee Douglas presents for an acute sick visit for symptoms of an upper respiratory infection --onset was about a week ago --reports yellow/green sputum, cough, chest tightness, wheezing, SOB, fatigue, fever and some chills on Saturday.  Negative for covid      Current Medication:  Outpatient Encounter Medications as of 12/30/2023  Medication Sig Note   aspirin 81 MG tablet Take by mouth. 02/20/2016: Received from: [facility]   atorvastatin (LIPITOR) 80 MG tablet  02/20/2016: Received from: External Pharmacy   azithromycin  (ZITHROMAX ) 250 MG tablet Take 2 tablets on day 1, then 1 tablet daily on days 2 through 5    chlorpheniramine-HYDROcodone (TUSSIONEX) 10-8 MG/5ML Take 5 mLs by mouth every 12 (twelve) hours as needed for cough.    clopidogrel (PLAVIX) 75 MG tablet  02/20/2016: Received from: External Pharmacy   dextromethorphan  (DELSYM ) 30 MG/5ML liquid Take 10 mLs (60 mg total) by mouth 2 (two) times daily as needed for cough.    icosapent Ethyl (VASCEPA) 1 g capsule Take 2 capsules by mouth 2 (two) times daily.    lisinopril (ZESTRIL) 20 MG tablet TAKE 1 TABLET BY MOUTH ONCE DAILY AND ONE-HALF TABLET  BY MOUTH AT NIGHT    metoprolol tartrate (LOPRESSOR) 25 MG tablet Take 1 tablet by mouth 2 (two) times daily.    Multiple Vitamins-Minerals (MULTIVITAMIN ADULT PO) Take by mouth. 02/20/2016: Received from: [facility]   nitroGLYCERIN (NITROSTAT) 0.4 MG SL tablet  02/20/2016: Received from: External Pharmacy   pantoprazole (PROTONIX) 20 MG tablet Take 20 mg by mouth 2 (two) times daily.    predniSONE  (STERAPRED UNI-PAK 21 TAB) 10 MG  (21) TBPK tablet Use as directed for 6 days    No facility-administered encounter medications on file as of 12/30/2023.      Medical History: Past Medical History:  Diagnosis Date   Coronary artery disease    Hyperlipidemia    Hypertension    Myocardial infarction (HCC)      Vital Signs: BP (!) 140/80   Pulse 62   Temp 98.2 F (36.8 C)   Resp 16   Ht 5\' 11"  (1.803 m)   Wt 275 lb 6.4 oz (124.9 kg)   SpO2 95%   BMI 38.41 kg/m    Review of Systems  Constitutional:  Positive for chills, fatigue and fever.  HENT:  Positive for congestion and postnasal drip. Negative for rhinorrhea, sinus pressure, sinus pain, sneezing, sore throat and trouble swallowing.   Respiratory:  Positive for cough, chest tightness, shortness of breath and wheezing.   Cardiovascular: Negative.  Negative for chest pain and palpitations.  Gastrointestinal: Negative.   Genitourinary: Negative.   Musculoskeletal: Negative.   Neurological: Negative.     Physical Exam Vitals reviewed.  Constitutional:      General: He is not in acute distress.    Appearance: Normal appearance. He is obese. He is ill-appearing.  HENT:     Head: Normocephalic and atraumatic.  Eyes:     Pupils: Pupils are equal, round, and reactive to light.  Cardiovascular:     Rate and Rhythm: Normal rate  and regular rhythm.     Heart sounds: Normal heart sounds. No murmur heard. Pulmonary:     Effort: Pulmonary effort is normal. No accessory muscle usage or respiratory distress.     Breath sounds: Examination of the right-upper field reveals rales. Examination of the right-middle field reveals rales. Examination of the left-middle field reveals rales. Examination of the right-lower field reveals rales. Examination of the left-lower field reveals rales. Rales present.     Comments: Pleural rub auscultated on the right side  Neurological:     Mental Status: He is alert and oriented to person, place, and time.  Psychiatric:         Mood and Affect: Mood normal.        Behavior: Behavior normal.       Assessment/Plan: 1. Walking pneumonia (Primary) Stat chest xray ordered. Zpak and prednisone  taper prescribed, take until gone. Tussionex prescribed for cough relief.  - DG Chest 2 View; Future - predniSONE  (STERAPRED UNI-PAK 21 TAB) 10 MG (21) TBPK tablet; Use as directed for 6 days  Dispense: 21 tablet; Refill: 0 - chlorpheniramine-HYDROcodone (TUSSIONEX) 10-8 MG/5ML; Take 5 mLs by mouth every 12 (twelve) hours as needed for cough.  Dispense: 140 mL; Refill: 0 - azithromycin  (ZITHROMAX ) 250 MG tablet; Take 2 tablets on day 1, then 1 tablet daily on days 2 through 5  Dispense: 6 tablet; Refill: 0  2. Bilateral rales Stat chest xray ordered  - DG Chest 2 View; Future  3. Wheezing Prednisone  taper prescribed, take until gone. Stat chest xray ordered  - DG Chest 2 View; Future - predniSONE  (STERAPRED UNI-PAK 21 TAB) 10 MG (21) TBPK tablet; Use as directed for 6 days  Dispense: 21 tablet; Refill: 0   General Counseling: Lee Douglas verbalizes understanding of the findings of todays visit and agrees with plan of treatment. I have discussed any further diagnostic evaluation that may be needed or ordered today. We also reviewed his medications today. he has been encouraged to call the office with any questions or concerns that should arise related to todays visit.    Counseling:    Orders Placed This Encounter  Procedures   DG Chest 2 View    Meds ordered this encounter  Medications   predniSONE  (STERAPRED UNI-PAK 21 TAB) 10 MG (21) TBPK tablet    Sig: Use as directed for 6 days    Dispense:  21 tablet    Refill:  0    Fill new script today   chlorpheniramine-HYDROcodone (TUSSIONEX) 10-8 MG/5ML    Sig: Take 5 mLs by mouth every 12 (twelve) hours as needed for cough.    Dispense:  140 mL    Refill:  0    Fill new script today   azithromycin  (ZITHROMAX ) 250 MG tablet    Sig: Take 2 tablets on day 1, then 1  tablet daily on days 2 through 5    Dispense:  6 tablet    Refill:  0    Return if symptoms worsen or fail to improve.  [place] Controlled Substance Database was reviewed by me for overdose risk score (ORS)  Time spent:30 Minutes Time spent with patient included reviewing progress notes, labs, imaging studies, and discussing plan for follow up.   This patient was seen by Laurence Pons, FNP-C in collaboration with Dr. Verneta Gone as a part of collaborative care agreement.  Ranisha Allaire R. Bobbi Burow, MSN, FNP-C Internal Medicine

## 2023-12-31 ENCOUNTER — Telehealth: Payer: Self-pay

## 2023-12-31 NOTE — Telephone Encounter (Signed)
 Pt advised as per alyssa that chest xray showed developing pneumonia and medication she gave you it good and if is not feeling better in few days call us  back and also make him appt in 4 weeks for follow up may repeat chest xray

## 2024-01-05 ENCOUNTER — Other Ambulatory Visit: Payer: Self-pay

## 2024-01-05 ENCOUNTER — Telehealth: Payer: Self-pay

## 2024-01-05 MED ORDER — LEVOFLOXACIN 750 MG PO TABS: 750.0000 mg | ORAL_TABLET | Freq: Every day | ORAL | 0 refills | Status: DC

## 2024-01-05 NOTE — Telephone Encounter (Signed)
 Pt  called that he not feeling  better he finished antibiotic due to pt had pneumonia as per alyssa send levaquin 750 for 7 days

## 2024-01-28 ENCOUNTER — Ambulatory Visit: Admitting: Nurse Practitioner

## 2024-01-28 ENCOUNTER — Encounter: Payer: Self-pay | Admitting: Nurse Practitioner

## 2024-01-28 ENCOUNTER — Ambulatory Visit
Admission: RE | Admit: 2024-01-28 | Discharge: 2024-01-28 | Disposition: A | Discharge status: Home / Self Care | Attending: Nurse Practitioner | Admitting: Nurse Practitioner

## 2024-01-28 ENCOUNTER — Ambulatory Visit
Admission: RE | Admit: 2024-01-28 | Discharge: 2024-01-28 | Disposition: A | Discharge status: Home / Self Care | Source: Ambulatory Visit | Attending: Nurse Practitioner | Admitting: Nurse Practitioner

## 2024-01-28 VITALS — BP 130/70 | HR 72 | Temp 97.8°F | Resp 16 | Ht 71.0 in | Wt 274.0 lb

## 2024-01-28 DIAGNOSIS — Z8701 Personal history of pneumonia (recurrent): Secondary | ICD-10-CM | POA: Insufficient documentation

## 2024-01-28 DIAGNOSIS — J189 Pneumonia, unspecified organism: Secondary | ICD-10-CM

## 2024-01-28 DIAGNOSIS — Z23 Encounter for immunization: Secondary | ICD-10-CM

## 2024-01-28 DIAGNOSIS — R918 Other nonspecific abnormal finding of lung field: Secondary | ICD-10-CM | POA: Diagnosis not present

## 2024-01-28 MED ORDER — PNEUMOCOCCAL 20-VAL CONJ VACC 0.5 ML IM SUSY: 0.5000 mL | PREFILLED_SYRINGE | Freq: Once | INTRAMUSCULAR | 0 refills | Status: DC | PRN

## 2024-01-28 NOTE — Progress Notes (Signed)
 [facility] [address] [place], [place] [address]  Internal MEDICINE  Office Visit Note  Patient Name: Lee Douglas  [id number]  [id number]  Date of Service: 01/28/2024  Chief Complaint  Patient presents with   Follow-up    F/u pneumonia    HPI Lee Douglas presents for a follow-up visit for  Had a recent pneumonia which was confirmed by chest xray. Initially given a zpak and then swithced to a course of levofoxacin which seemed to resolve the infection Patient reports that he feels much better today.      Current Medication: Outpatient Encounter Medications as of 01/28/2024  Medication Sig Note   aspirin 81 MG tablet Take by mouth. 02/20/2016: Received from: [facility]   atorvastatin (LIPITOR) 80 MG tablet  02/20/2016: Received from: External Pharmacy   chlorpheniramine-HYDROcodone (TUSSIONEX) 10-8 MG/5ML Take 5 mLs by mouth every 12 (twelve) hours as needed for cough.    clopidogrel (PLAVIX) 75 MG tablet  02/20/2016: Received from: External Pharmacy   dextromethorphan  (DELSYM ) 30 MG/5ML liquid Take 10 mLs (60 mg total) by mouth 2 (two) times daily as needed for cough.    icosapent Ethyl (VASCEPA) 1 g capsule Take 2 capsules by mouth 2 (two) times daily.    levofloxacin  (LEVAQUIN ) 750 MG tablet Take 1 tablet (750 mg total) by mouth daily.    lisinopril (ZESTRIL) 20 MG tablet TAKE 1 TABLET BY MOUTH ONCE DAILY AND ONE-HALF TABLET  BY MOUTH AT NIGHT    metoprolol tartrate (LOPRESSOR) 25 MG tablet Take 1 tablet by mouth 2 (two) times daily.    Multiple Vitamins-Minerals (MULTIVITAMIN ADULT PO) Take by mouth. 02/20/2016: Received from: [facility]   nitroGLYCERIN (NITROSTAT) 0.4 MG SL tablet  02/20/2016: Received from: External Pharmacy   pantoprazole (PROTONIX) 20 MG tablet Take 20 mg by mouth 2 (two) times daily.    pneumococcal 20-valent conjugate vaccine (PREVNAR 20) 0.5 ML injection Inject 0.5 mLs into the muscle once as needed for up to 1  dose for immunization.    predniSONE  (STERAPRED UNI-PAK 21 TAB) 10 MG (21) TBPK tablet Use as directed for 6 days    No facility-administered encounter medications on file as of 01/28/2024.    Surgical History: Past Surgical History:  Procedure Laterality Date   COLONOSCOPY WITH PROPOFOL  N/A 04/18/2020   Procedure: COLONOSCOPY WITH PROPOFOL ;  Surgeon: Shane Darling, MD;  Location: [facility] ENDOSCOPY;  Service: Endoscopy;  Laterality: N/A;   COLONOSCOPY WITH PROPOFOL  N/A 12/01/2023   Procedure: COLONOSCOPY WITH PROPOFOL ;  Surgeon: Shane Darling, MD;  Location: [facility] ENDOSCOPY;  Service: Endoscopy;  Laterality: N/A;   CORONARY ANGIOPLASTY     ESOPHAGOGASTRODUODENOSCOPY N/A 06/19/2020   Procedure: ESOPHAGOGASTRODUODENOSCOPY (EGD);  Surgeon: Shane Darling, MD;  Location: [facility] ENDOSCOPY;  Service: Endoscopy;  Laterality: N/A;   ESOPHAGOGASTRODUODENOSCOPY (EGD) WITH PROPOFOL  N/A 04/18/2020   Procedure: ESOPHAGOGASTRODUODENOSCOPY (EGD) WITH PROPOFOL ;  Surgeon: Shane Darling, MD;  Location: [facility] ENDOSCOPY;  Service: Endoscopy;  Laterality: N/A;   EXPLORATION POST OPERATIVE OPEN HEART     HERNIA REPAIR     POLYPECTOMY  12/01/2023   Procedure: POLYPECTOMY, INTESTINE;  Surgeon: Shane Darling, MD;  Location: [facility] ENDOSCOPY;  Service: Endoscopy;;   SINUS SURGERY WITH INSTATRAK      Medical History: Past Medical History:  Diagnosis Date   Coronary artery disease    Hyperlipidemia    Hypertension    Myocardial infarction [facility])     Family History: Family History  Problem  Relation Age of Onset   Stroke Father     Social History   Socioeconomic History   Marital status: Married    Spouse name: Not on file   Number of children: Not on file   Years of education: Not on file   Highest education level: Not on file  Occupational History   Not on file  Tobacco Use   Smoking status: Former   Smokeless tobacco: Current    Types: Chew  Vaping Use   Vaping status: Never  Used  Substance and Sexual Activity   Alcohol use: Yes   Drug use: Never   Sexual activity: Not on file  Other Topics Concern   Not on file  Social History Narrative   Not on file   Social Drivers of Health   Financial Resource Strain: Not on file  Food Insecurity: Not on file  Transportation Needs: Not on file  Physical Activity: Not on file  Stress: Not on file  Social Connections: Not on file  Intimate Partner Violence: Not on file      Review of Systems  Constitutional: Negative.  Negative for fatigue.  Respiratory: Negative.  Negative for cough, chest tightness, shortness of breath and wheezing.   Cardiovascular: Negative.  Negative for chest pain and palpitations.  Gastrointestinal: Negative.   Musculoskeletal: Negative.   Neurological: Negative.     Vital Signs: BP 130/70   Pulse 72   Temp 97.8 F (36.6 C)   Resp 16   Ht 5\' 11"  (1.803 m)   Wt 274 lb (124.3 kg)   SpO2 99%   BMI 38.22 kg/m    Physical Exam Vitals reviewed.  Constitutional:      General: He is not in acute distress.    Appearance: Normal appearance. He is obese. He is not ill-appearing.  HENT:     Head: Normocephalic and atraumatic.  Eyes:     Pupils: Pupils are equal, round, and reactive to light.  Cardiovascular:     Rate and Rhythm: Normal rate and regular rhythm.     Heart sounds: Normal heart sounds. No murmur heard. Pulmonary:     Effort: Pulmonary effort is normal. No respiratory distress.     Breath sounds: Normal breath sounds. No wheezing.  Skin:    General: Skin is warm and dry.     Capillary Refill: Capillary refill takes less than 2 seconds.  Neurological:     Mental Status: He is alert and oriented to person, place, and time.  Psychiatric:        Mood and Affect: Mood normal.        Behavior: Behavior normal.        Assessment/Plan: 1. Walking pneumonia Symptoms resolved. Follow up chest xray ordered to evaluate for resolution of infection.  - DG Chest 2  View; Future  2. History of recent pneumonia (Primary) Follow up chest xray to evaluate for resolution of pneumonia - DG Chest 2 View; Future  3. Need for vaccination - pneumococcal 20-valent conjugate vaccine (PREVNAR 20) 0.5 ML injection; Inject 0.5 mLs into the muscle once as needed for up to 1 dose for immunization.  Dispense: 0.5 mL; Refill: 0   General Counseling: Ohn verbalizes understanding of the findings of todays visit and agrees with plan of treatment. I have discussed any further diagnostic evaluation that may be needed or ordered today. We also reviewed his medications today. he has been encouraged to call the office with any questions or concerns that should arise  related to todays visit.    Orders Placed This Encounter  Procedures   DG Chest 2 View    Meds ordered this encounter  Medications   pneumococcal 20-valent conjugate vaccine (PREVNAR 20) 0.5 ML injection    Sig: Inject 0.5 mLs into the muscle once as needed for up to 1 dose for immunization.    Dispense:  0.5 mL    Refill:  0    Patient needs prevnar 20 vaccine.    Return if symptoms worsen or fail to improve.   Total time spent:30 Minutes Time spent includes review of chart, medications, test results, and follow up plan with the patient.   [place] Controlled Substance Database was reviewed by me.  This patient was seen by Laurence Pons, FNP-C in collaboration with Dr. Verneta Gone as a part of collaborative care agreement.   Taji Barretto R. Bobbi Burow, MSN, FNP-C Internal medicine

## 2024-02-06 ENCOUNTER — Other Ambulatory Visit: Payer: Self-pay | Admitting: Nurse Practitioner

## 2024-02-06 DIAGNOSIS — R918 Other nonspecific abnormal finding of lung field: Secondary | ICD-10-CM

## 2024-02-06 DIAGNOSIS — R911 Solitary pulmonary nodule: Secondary | ICD-10-CM

## 2024-02-12 ENCOUNTER — Ambulatory Visit
Admission: RE | Admit: 2024-02-12 | Discharge: 2024-02-12 | Disposition: A | Discharge status: Home / Self Care | Source: Ambulatory Visit | Attending: Nurse Practitioner | Admitting: Nurse Practitioner

## 2024-02-12 DIAGNOSIS — I2510 Atherosclerotic heart disease of native coronary artery without angina pectoris: Secondary | ICD-10-CM | POA: Diagnosis not present

## 2024-02-12 DIAGNOSIS — J948 Other specified pleural conditions: Secondary | ICD-10-CM | POA: Diagnosis not present

## 2024-02-12 DIAGNOSIS — K760 Fatty (change of) liver, not elsewhere classified: Secondary | ICD-10-CM | POA: Diagnosis not present

## 2024-02-12 DIAGNOSIS — R918 Other nonspecific abnormal finding of lung field: Secondary | ICD-10-CM

## 2024-02-12 DIAGNOSIS — R911 Solitary pulmonary nodule: Secondary | ICD-10-CM

## 2024-02-16 ENCOUNTER — Encounter: Payer: Self-pay | Admitting: Nurse Practitioner

## 2024-02-16 ENCOUNTER — Ambulatory Visit: Payer: Medicare PPO | Admitting: Nurse Practitioner

## 2024-02-16 VITALS — BP 130/80 | HR 89 | Temp 97.8°F | Resp 16 | Ht 71.0 in | Wt 277.0 lb

## 2024-02-16 DIAGNOSIS — J948 Other specified pleural conditions: Secondary | ICD-10-CM

## 2024-02-16 DIAGNOSIS — I10 Essential (primary) hypertension: Secondary | ICD-10-CM

## 2024-02-16 DIAGNOSIS — Z8701 Personal history of pneumonia (recurrent): Secondary | ICD-10-CM

## 2024-02-16 DIAGNOSIS — K760 Fatty (change of) liver, not elsewhere classified: Secondary | ICD-10-CM | POA: Diagnosis not present

## 2024-02-16 DIAGNOSIS — R7303 Prediabetes: Secondary | ICD-10-CM

## 2024-02-16 DIAGNOSIS — R001 Bradycardia, unspecified: Secondary | ICD-10-CM

## 2024-02-16 DIAGNOSIS — E785 Hyperlipidemia, unspecified: Secondary | ICD-10-CM

## 2024-02-16 DIAGNOSIS — R9431 Abnormal electrocardiogram [ECG] [EKG]: Secondary | ICD-10-CM | POA: Diagnosis not present

## 2024-02-16 DIAGNOSIS — Z0000 Encounter for general adult medical examination without abnormal findings: Secondary | ICD-10-CM

## 2024-02-16 NOTE — Progress Notes (Signed)
 [facility] [address] [place], [place] [address]  Internal MEDICINE  Office Visit Note  Patient Name: Lee Douglas  [id number]  [id number]  Date of Service: 02/16/2024  Chief Complaint  Patient presents with   Medicare Wellness    HPI Lee Douglas presents for an annual well visit and physical exam.  Well-appearing 68 y.o. male with hypertension, hyperlipidemia, CAD, eosinophilic esophagitis, and esophageal stricture  Routine CRC screening: done in march this year.  Labs: having labs done by [facility] this month.  New or worsening pain:none  Other concerns: CT chest -- pleural calcifications, and some Mild fatty liver  Heart rate is 33-35 bpm while sitting, increases to normal range when he stands up. He denies any SOB, chest pain, dizziness, lightheadedness, syncope or near syncope.  EKG done in office today and was abnormal with multiple ectopic ventricular beats including ventricular bigeminy. He is on metoprolol which is prescribed by [facility]     02/16/2024    8:44 AM 02/13/2023    8:41 AM  MMSE - Mini Mental State Exam  Orientation to time 5 5  Orientation to Place 5 5  Registration 3 3  Attention/ Calculation 5 5  Recall 3 3  Language- name 2 objects 2 2  Language- repeat 1 1  Language- follow 3 step command 3 3  Language- read & follow direction 1 1  Write a sentence 1 1  Copy design 1 1  Total score 30 30    Functional Status Survey: Is the patient deaf or have difficulty hearing?: No Does the patient have difficulty seeing, even when wearing glasses/contacts?: No Does the patient have difficulty concentrating, remembering, or making decisions?: No Does the patient have difficulty walking or climbing stairs?: No Does the patient have difficulty dressing or bathing?: No Does the patient have difficulty doing errands alone such as visiting a doctor's office or shopping?: No     06/19/2020    7:23 AM 01/08/2022    2:17 PM 02/13/2023    8:40 AM 02/16/2024     8:45 AM 02/16/2024    8:46 AM  Fall Risk  Falls in the past year?  0 0  0  (RETIRED) Patient Fall Risk Level Low fall risk Low fall risk     Patient at Risk for Falls Due to  No Fall Risks  No Fall Risks No Fall Risks  Fall risk Follow up  Falls evaluation completed  Falls evaluation completed Falls evaluation completed       02/16/2024    8:46 AM  Depression screen PHQ 2/9  Decreased Interest 0  Down, Depressed, Hopeless 0  PHQ - 2 Score 0      Current Medication: Outpatient Encounter Medications as of 02/16/2024  Medication Sig Note   aspirin 81 MG tablet Take by mouth. 02/20/2016: Received from: [facility]   atorvastatin (LIPITOR) 80 MG tablet  02/20/2016: Received from: External Pharmacy   clopidogrel (PLAVIX) 75 MG tablet  02/20/2016: Received from: External Pharmacy   icosapent Ethyl (VASCEPA) 1 g capsule Take 2 capsules by mouth 2 (two) times daily.    lisinopril (ZESTRIL) 20 MG tablet TAKE 1 TABLET BY MOUTH ONCE DAILY AND ONE-HALF TABLET  BY MOUTH AT NIGHT    metoprolol tartrate (LOPRESSOR) 25 MG tablet Take 1 tablet by mouth 2 (two) times daily.    Multiple Vitamins-Minerals (MULTIVITAMIN ADULT PO) Take by mouth. 02/20/2016: Received from: [facility]   nitroGLYCERIN (NITROSTAT) 0.4 MG SL  tablet  02/20/2016: Received from: External Pharmacy   pantoprazole (PROTONIX) 20 MG tablet Take 20 mg by mouth 2 (two) times daily.    pneumococcal 20-valent conjugate vaccine (PREVNAR 20) 0.5 ML injection Inject 0.5 mLs into the muscle once as needed for up to 1 dose for immunization.    [DISCONTINUED] chlorpheniramine-HYDROcodone (TUSSIONEX) 10-8 MG/5ML Take 5 mLs by mouth every 12 (twelve) hours as needed for cough.    [DISCONTINUED] dextromethorphan  (DELSYM ) 30 MG/5ML liquid Take 10 mLs (60 mg total) by mouth 2 (two) times daily as needed for cough.    [DISCONTINUED] levofloxacin  (LEVAQUIN ) 750 MG tablet Take 1 tablet (750 mg total) by mouth daily.     [DISCONTINUED] predniSONE  (STERAPRED UNI-PAK 21 TAB) 10 MG (21) TBPK tablet Use as directed for 6 days    No facility-administered encounter medications on file as of 02/16/2024.    Surgical History: Past Surgical History:  Procedure Laterality Date   COLONOSCOPY WITH PROPOFOL  N/A 04/18/2020   Procedure: COLONOSCOPY WITH PROPOFOL ;  Surgeon: Shane Darling, MD;  Location: [facility] ENDOSCOPY;  Service: Endoscopy;  Laterality: N/A;   COLONOSCOPY WITH PROPOFOL  N/A 12/01/2023   Procedure: COLONOSCOPY WITH PROPOFOL ;  Surgeon: Shane Darling, MD;  Location: [facility] ENDOSCOPY;  Service: Endoscopy;  Laterality: N/A;   CORONARY ANGIOPLASTY     ESOPHAGOGASTRODUODENOSCOPY N/A 06/19/2020   Procedure: ESOPHAGOGASTRODUODENOSCOPY (EGD);  Surgeon: Shane Darling, MD;  Location: [facility] ENDOSCOPY;  Service: Endoscopy;  Laterality: N/A;   ESOPHAGOGASTRODUODENOSCOPY (EGD) WITH PROPOFOL  N/A 04/18/2020   Procedure: ESOPHAGOGASTRODUODENOSCOPY (EGD) WITH PROPOFOL ;  Surgeon: Shane Darling, MD;  Location: [facility] ENDOSCOPY;  Service: Endoscopy;  Laterality: N/A;   EXPLORATION POST OPERATIVE OPEN HEART     HERNIA REPAIR     POLYPECTOMY  12/01/2023   Procedure: POLYPECTOMY, INTESTINE;  Surgeon: Shane Darling, MD;  Location: [facility] ENDOSCOPY;  Service: Endoscopy;;   SINUS SURGERY WITH INSTATRAK      Medical History: Past Medical History:  Diagnosis Date   Coronary artery disease    Hyperlipidemia    Hypertension    Myocardial infarction [facility])     Family History: Family History  Problem Relation Age of Onset   Stroke Father     Social History   Socioeconomic History   Marital status: Married    Spouse name: Not on file   Number of children: Not on file   Years of education: Not on file   Highest education level: Not on file  Occupational History   Not on file  Tobacco Use   Smoking status: Former   Smokeless tobacco: Current    Types: Chew  Vaping Use   Vaping status: Never Used   Substance and Sexual Activity   Alcohol use: Yes   Drug use: Never   Sexual activity: Not on file  Other Topics Concern   Not on file  Social History Narrative   Not on file   Social Drivers of Health   Financial Resource Strain: Not on file  Food Insecurity: Not on file  Transportation Needs: Not on file  Physical Activity: Not on file  Stress: Not on file  Social Connections: Not on file  Intimate Partner Violence: Not on file      Review of Systems  Constitutional:  Positive for fatigue. Negative for activity change, appetite change, chills, fever and unexpected weight change.  HENT: Negative.  Negative for congestion, ear pain, rhinorrhea, sore throat and trouble swallowing.   Eyes: Negative.   Respiratory: Negative.  Negative for  cough, chest tightness, shortness of breath and wheezing.   Cardiovascular: Negative.  Negative for chest pain and palpitations.  Gastrointestinal: Negative.  Negative for abdominal pain, blood in stool, constipation, diarrhea, nausea and vomiting.  Endocrine: Negative.   Genitourinary: Negative.  Negative for difficulty urinating, dysuria, frequency, hematuria and urgency.  Musculoskeletal: Negative.  Negative for arthralgias, back pain, joint swelling, myalgias and neck pain.  Skin: Negative.  Negative for rash and wound.  Allergic/Immunologic: Negative.  Negative for immunocompromised state.  Neurological: Negative.  Negative for dizziness, seizures, numbness and headaches.  Hematological: Negative.   Psychiatric/Behavioral: Negative.  Negative for behavioral problems, self-injury and suicidal ideas. The patient is not nervous/anxious.     Vital Signs: BP 130/80   Pulse 89   Temp 97.8 F (36.6 C)   Resp 16   Ht 5' 11 (1.803 m)   Wt 277 lb (125.6 kg)   SpO2 98%   BMI 38.63 kg/m    Physical Exam Vitals reviewed.  Constitutional:      General: He is awake. He is not in acute distress.    Appearance: Normal appearance. He is  well-developed and well-groomed. He is obese. He is not ill-appearing or diaphoretic.  HENT:     Head: Normocephalic and atraumatic.     Right Ear: Tympanic membrane, ear canal and external ear normal.     Left Ear: Tympanic membrane, ear canal and external ear normal.     Nose: Nose normal. No congestion or rhinorrhea.     Mouth/Throat:     Lips: Pink.     Mouth: Mucous membranes are moist.     Pharynx: Oropharynx is clear. Uvula midline. No oropharyngeal exudate or posterior oropharyngeal erythema.   Eyes:     General: Lids are normal. Vision grossly intact. Gaze aligned appropriately. No scleral icterus.       Right eye: No discharge.        Left eye: No discharge.     Extraocular Movements: Extraocular movements intact.     Conjunctiva/sclera: Conjunctivae normal.     Pupils: Pupils are equal, round, and reactive to light.     Funduscopic exam:    Right eye: Red reflex present.        Left eye: Red reflex present.  Neck:     Thyroid: No thyromegaly.     Vascular: No JVD.     Trachea: Trachea and phonation normal. No tracheal deviation.   Cardiovascular:     Rate and Rhythm: Normal rate and regular rhythm.     Pulses: Normal pulses.     Heart sounds: Normal heart sounds, S1 normal and S2 normal. No murmur heard.    No friction rub. No gallop.  Pulmonary:     Effort: Pulmonary effort is normal. No accessory muscle usage or respiratory distress.     Breath sounds: Normal breath sounds and air entry. No stridor. No wheezing or rales.  Chest:     Chest wall: No tenderness.  Abdominal:     General: Bowel sounds are normal. There is no distension.     Palpations: Abdomen is soft. There is no shifting dullness, fluid wave, mass or pulsatile mass.     Tenderness: There is no abdominal tenderness. There is no guarding or rebound.   Musculoskeletal:        General: No tenderness or deformity. Normal range of motion.     Cervical back: Normal range of motion and neck supple.      Right lower leg:  No edema.     Left lower leg: No edema.  Lymphadenopathy:     Cervical: No cervical adenopathy.   Skin:    General: Skin is warm and dry.     Capillary Refill: Capillary refill takes less than 2 seconds.     Coloration: Skin is not pale.     Findings: No erythema or rash.   Neurological:     Mental Status: He is alert and oriented to person, place, and time.     Cranial Nerves: No cranial nerve deficit.     Motor: No abnormal muscle tone.     Coordination: Coordination normal.     Gait: Gait normal.     Deep Tendon Reflexes: Reflexes are normal and symmetric.   Psychiatric:        Mood and Affect: Mood normal.        Behavior: Behavior normal. Behavior is cooperative.        Thought Content: Thought content normal.        Judgment: Judgment normal.       Assessment/Plan: 1. Encounter for subsequent annual wellness visit (AWV) in Medicare patient (Primary) Age-appropriate preventive screenings and vaccinations discussed, annual physical exam completed. Routine labs for health maintenance not ordered. Patient will have routine labs drawn when he sees his cardiologist this month. PHM updated.    2. Pleural calcification Most likely a result of inflammation from the recent pneumonia he had. Will follow up with additional imaging in 6-12 months.   3. NAFLD (nonalcoholic fatty liver disease) Discussed diet and lifestyle changes. Information given to patient in handouts.   4. Abnormal EKG His cardiologist was notified via phone.   5. Asymptomatic bradycardia EKG is abnormal with frequent PVCs, ventricular bigeminy and variable p waves. His cardiologist Dr. Marcelino Sera was contacted at [facility] and the results were discussed in detail with her. Copies of the EKG were faxed to her office so she can determine if he needs to hold the metoprolol or if he can continue to take it as prescribed. Her office will call him and let him know what to do.  - EKG 12-Lead  6.  Primary hypertension Stable, Continue lisinopril as prescribed. Hold metoprolol until he hears from his cardiologist's office.   7. Prediabetes Continue low carb low sugar diet   8. Dyslipidemia Continue atorvastatin as prescribed.   9. History of recent pneumonia Resolved with some pleural calcifications      General Counseling: Lee Douglas verbalizes understanding of the findings of todays visit and agrees with plan of treatment. I have discussed any further diagnostic evaluation that may be needed or ordered today. We also reviewed his medications today. he has been encouraged to call the office with any questions or concerns that should arise related to todays visit.    Orders Placed This Encounter  Procedures   EKG 12-Lead    No orders of the defined types were placed in this encounter.   Return in about 3 months (around 05/18/2024) for F/U, Herberta Pickron PCP.   Total time spent:30 Minutes Time spent includes review of chart, medications, test results, and follow up plan with the patient.   [place] Douglas Controlled Substance Database was reviewed by me.  This patient was seen by Laurence Pons, FNP-C in collaboration with Dr. Verneta Gone as a part of collaborative care agreement.  Obediah Welles R. Bobbi Burow, MSN, FNP-C Internal medicine

## 2024-02-17 ENCOUNTER — Telehealth: Payer: Self-pay

## 2024-02-17 NOTE — Telephone Encounter (Signed)
 Faxed EKG to [phone] to [facility] for dr Marcelino Sera

## 2024-02-17 NOTE — Telephone Encounter (Signed)
 Error

## 2024-02-20 ENCOUNTER — Encounter: Payer: Self-pay | Admitting: Nurse Practitioner

## 2024-03-08 DIAGNOSIS — R600 Localized edema: Secondary | ICD-10-CM | POA: Diagnosis not present

## 2024-03-08 DIAGNOSIS — E785 Hyperlipidemia, unspecified: Secondary | ICD-10-CM | POA: Diagnosis not present

## 2024-03-08 DIAGNOSIS — R5382 Chronic fatigue, unspecified: Secondary | ICD-10-CM | POA: Diagnosis not present

## 2024-03-08 DIAGNOSIS — K200 Eosinophilic esophagitis: Secondary | ICD-10-CM | POA: Diagnosis not present

## 2024-03-08 DIAGNOSIS — I25119 Atherosclerotic heart disease of native coronary artery with unspecified angina pectoris: Secondary | ICD-10-CM | POA: Diagnosis not present

## 2024-03-15 DIAGNOSIS — R001 Bradycardia, unspecified: Secondary | ICD-10-CM | POA: Diagnosis not present

## 2024-03-23 DIAGNOSIS — I493 Ventricular premature depolarization: Secondary | ICD-10-CM | POA: Diagnosis not present

## 2024-03-25 DIAGNOSIS — I455 Other specified heart block: Secondary | ICD-10-CM | POA: Diagnosis not present

## 2024-03-25 DIAGNOSIS — I493 Ventricular premature depolarization: Secondary | ICD-10-CM | POA: Diagnosis not present

## 2024-03-25 DIAGNOSIS — I491 Atrial premature depolarization: Secondary | ICD-10-CM | POA: Diagnosis not present

## 2024-03-25 DIAGNOSIS — I4729 Other ventricular tachycardia: Secondary | ICD-10-CM | POA: Diagnosis not present

## 2024-03-25 DIAGNOSIS — I495 Sick sinus syndrome: Secondary | ICD-10-CM | POA: Diagnosis not present

## 2024-03-29 DIAGNOSIS — I495 Sick sinus syndrome: Secondary | ICD-10-CM | POA: Diagnosis not present

## 2024-03-29 DIAGNOSIS — I4729 Other ventricular tachycardia: Secondary | ICD-10-CM | POA: Diagnosis not present

## 2024-03-29 DIAGNOSIS — I455 Other specified heart block: Secondary | ICD-10-CM | POA: Diagnosis not present

## 2024-03-29 DIAGNOSIS — I493 Ventricular premature depolarization: Secondary | ICD-10-CM | POA: Diagnosis not present

## 2024-03-29 DIAGNOSIS — I491 Atrial premature depolarization: Secondary | ICD-10-CM | POA: Diagnosis not present

## 2024-04-05 DIAGNOSIS — I495 Sick sinus syndrome: Secondary | ICD-10-CM | POA: Diagnosis not present

## 2024-04-05 DIAGNOSIS — Z951 Presence of aortocoronary bypass graft: Secondary | ICD-10-CM | POA: Diagnosis not present

## 2024-04-05 DIAGNOSIS — I2510 Atherosclerotic heart disease of native coronary artery without angina pectoris: Secondary | ICD-10-CM | POA: Diagnosis not present

## 2024-04-05 DIAGNOSIS — I25119 Atherosclerotic heart disease of native coronary artery with unspecified angina pectoris: Secondary | ICD-10-CM | POA: Diagnosis not present

## 2024-04-05 DIAGNOSIS — I10 Essential (primary) hypertension: Secondary | ICD-10-CM | POA: Diagnosis not present

## 2024-04-05 DIAGNOSIS — I493 Ventricular premature depolarization: Secondary | ICD-10-CM | POA: Diagnosis not present

## 2024-04-05 DIAGNOSIS — J449 Chronic obstructive pulmonary disease, unspecified: Secondary | ICD-10-CM | POA: Diagnosis not present

## 2024-04-05 DIAGNOSIS — I491 Atrial premature depolarization: Secondary | ICD-10-CM | POA: Diagnosis not present

## 2024-04-05 DIAGNOSIS — I252 Old myocardial infarction: Secondary | ICD-10-CM | POA: Diagnosis not present

## 2024-04-05 DIAGNOSIS — I472 Ventricular tachycardia, unspecified: Secondary | ICD-10-CM | POA: Diagnosis not present

## 2024-04-06 DIAGNOSIS — Z951 Presence of aortocoronary bypass graft: Secondary | ICD-10-CM | POA: Diagnosis not present

## 2024-04-06 DIAGNOSIS — I25119 Atherosclerotic heart disease of native coronary artery with unspecified angina pectoris: Secondary | ICD-10-CM | POA: Diagnosis not present

## 2024-04-06 DIAGNOSIS — I252 Old myocardial infarction: Secondary | ICD-10-CM | POA: Diagnosis not present

## 2024-04-06 DIAGNOSIS — I491 Atrial premature depolarization: Secondary | ICD-10-CM | POA: Diagnosis not present

## 2024-04-06 DIAGNOSIS — I2510 Atherosclerotic heart disease of native coronary artery without angina pectoris: Secondary | ICD-10-CM | POA: Diagnosis not present

## 2024-04-06 DIAGNOSIS — I472 Ventricular tachycardia, unspecified: Secondary | ICD-10-CM | POA: Diagnosis not present

## 2024-04-06 DIAGNOSIS — I495 Sick sinus syndrome: Secondary | ICD-10-CM | POA: Diagnosis not present

## 2024-04-06 DIAGNOSIS — J449 Chronic obstructive pulmonary disease, unspecified: Secondary | ICD-10-CM | POA: Diagnosis not present

## 2024-04-06 DIAGNOSIS — I10 Essential (primary) hypertension: Secondary | ICD-10-CM | POA: Diagnosis not present

## 2024-04-06 DIAGNOSIS — I493 Ventricular premature depolarization: Secondary | ICD-10-CM | POA: Diagnosis not present

## 2024-05-19 ENCOUNTER — Ambulatory Visit (INDEPENDENT_AMBULATORY_CARE_PROVIDER_SITE_OTHER): Admitting: Nurse Practitioner

## 2024-05-19 ENCOUNTER — Encounter: Payer: Self-pay | Admitting: Nurse Practitioner

## 2024-05-19 VITALS — BP 128/60 | HR 62 | Temp 97.8°F | Resp 16 | Ht 71.0 in | Wt 276.0 lb

## 2024-05-19 DIAGNOSIS — K760 Fatty (change of) liver, not elsewhere classified: Secondary | ICD-10-CM | POA: Diagnosis not present

## 2024-05-19 DIAGNOSIS — J948 Other specified pleural conditions: Secondary | ICD-10-CM | POA: Diagnosis not present

## 2024-05-19 DIAGNOSIS — I495 Sick sinus syndrome: Secondary | ICD-10-CM

## 2024-05-19 DIAGNOSIS — Z950 Presence of cardiac pacemaker: Secondary | ICD-10-CM | POA: Diagnosis not present

## 2024-05-19 NOTE — Progress Notes (Signed)
 [facility] [address] [place], [place] [address]  Internal MEDICINE  Office Visit Note  Patient Name: Lee Douglas  [id number]  [id number]  Date of Service: 05/19/2024  Chief Complaint  Patient presents with   Follow-up   Hypertension    HPI Gerber presents for a follow-up visit for pleural calcification, SSS and NAFLD Chronic pleural calcification -- need to repeat CT chest SSS -- had pacemaker placed recently, doing better NAFLD -- last labs were normal.     Current Medication: Outpatient Encounter Medications as of 05/19/2024  Medication Sig Note   aspirin 81 MG tablet Take by mouth. 02/20/2016: Received from: [facility]   atorvastatin (LIPITOR) 80 MG tablet  02/20/2016: Received from: External Pharmacy   clopidogrel (PLAVIX) 75 MG tablet  02/20/2016: Received from: External Pharmacy   fenofibrate (TRICOR) 145 MG tablet Take 145 mg by mouth.    icosapent Ethyl (VASCEPA) 1 g capsule Take 2 capsules by mouth 2 (two) times daily.    lisinopril (ZESTRIL) 20 MG tablet TAKE 1 TABLET BY MOUTH ONCE DAILY AND ONE-HALF TABLET  BY MOUTH AT NIGHT    metoprolol succinate (TOPROL-XL) 50 MG 24 hr tablet Take 50 mg by mouth 2 (two) times daily.    Multiple Vitamins-Minerals (MULTIVITAMIN ADULT PO) Take by mouth. 02/20/2016: Received from: [facility]   nitroGLYCERIN (NITROSTAT) 0.4 MG SL tablet  02/20/2016: Received from: External Pharmacy   pantoprazole (PROTONIX) 20 MG tablet Take 20 mg by mouth 2 (two) times daily.    pneumococcal 20-valent conjugate vaccine (PREVNAR 20) 0.5 ML injection Inject 0.5 mLs into the muscle once as needed for up to 1 dose for immunization.    [DISCONTINUED] metoprolol tartrate (LOPRESSOR) 25 MG tablet Take 1 tablet by mouth 2 (two) times daily.    No facility-administered encounter medications on file as of 05/19/2024.    Surgical History: Past Surgical History:  Procedure Laterality Date   COLONOSCOPY WITH  PROPOFOL  N/A 04/18/2020   Procedure: COLONOSCOPY WITH PROPOFOL ;  Surgeon: Maryruth Ole DASEN, MD;  Location: [facility] ENDOSCOPY;  Service: Endoscopy;  Laterality: N/A;   COLONOSCOPY WITH PROPOFOL  N/A 12/01/2023   Procedure: COLONOSCOPY WITH PROPOFOL ;  Surgeon: Maryruth Ole DASEN, MD;  Location: [facility] ENDOSCOPY;  Service: Endoscopy;  Laterality: N/A;   CORONARY ANGIOPLASTY     ESOPHAGOGASTRODUODENOSCOPY N/A 06/19/2020   Procedure: ESOPHAGOGASTRODUODENOSCOPY (EGD);  Surgeon: Maryruth Ole DASEN, MD;  Location: [facility] ENDOSCOPY;  Service: Endoscopy;  Laterality: N/A;   ESOPHAGOGASTRODUODENOSCOPY (EGD) WITH PROPOFOL  N/A 04/18/2020   Procedure: ESOPHAGOGASTRODUODENOSCOPY (EGD) WITH PROPOFOL ;  Surgeon: Maryruth Ole DASEN, MD;  Location: [facility] ENDOSCOPY;  Service: Endoscopy;  Laterality: N/A;   EXPLORATION POST OPERATIVE OPEN HEART     HERNIA REPAIR     POLYPECTOMY  12/01/2023   Procedure: POLYPECTOMY, INTESTINE;  Surgeon: Maryruth Ole DASEN, MD;  Location: [facility] ENDOSCOPY;  Service: Endoscopy;;   SINUS SURGERY WITH INSTATRAK      Medical History: Past Medical History:  Diagnosis Date   Coronary artery disease    Hyperlipidemia    Hypertension    Myocardial infarction [facility])     Family History: Family History  Problem Relation Age of Onset   Stroke Father     Social History   Socioeconomic History   Marital status: Married    Spouse name: Not on file   Number of children: Not on file   Years of education: Not on file   Highest education level: Not on file  Occupational History  Not on file  Tobacco Use   Smoking status: Former   Smokeless tobacco: Current    Types: Chew  Vaping Use   Vaping status: Never Used  Substance and Sexual Activity   Alcohol use: Yes   Drug use: Never   Sexual activity: Not on file  Other Topics Concern   Not on file  Social History Narrative   Not on file   Social Drivers of Health   Financial Resource Strain: Not on file  Food Insecurity: Not on  file  Transportation Needs: Not on file  Physical Activity: Not on file  Stress: Not on file  Social Connections: Not on file  Intimate Partner Violence: Not on file      Review of Systems  Constitutional:  Negative for activity change, appetite change, chills, fatigue, fever and unexpected weight change.  HENT: Negative.  Negative for congestion, ear pain, rhinorrhea, sore throat and trouble swallowing.   Eyes: Negative.   Respiratory: Negative.  Negative for cough, chest tightness, shortness of breath and wheezing.   Cardiovascular: Negative.  Negative for chest pain and palpitations.  Gastrointestinal: Negative.  Negative for abdominal pain, blood in stool, constipation, diarrhea, nausea and vomiting.  Endocrine: Negative.   Genitourinary: Negative.  Negative for difficulty urinating, dysuria, frequency, hematuria and urgency.  Musculoskeletal: Negative.  Negative for arthralgias, back pain, joint swelling, myalgias and neck pain.  Skin: Negative.  Negative for rash and wound.  Allergic/Immunologic: Negative.  Negative for immunocompromised state.  Neurological: Negative.  Negative for dizziness, seizures, numbness and headaches.  Hematological: Negative.   Psychiatric/Behavioral: Negative.  Negative for behavioral problems, self-injury and suicidal ideas. The patient is not nervous/anxious.     Vital Signs: BP 128/60 Comment: 140/90  Pulse 62   Temp 97.8 F (36.6 C)   Resp 16   Ht 5' 11 (1.803 m)   Wt 276 lb (125.2 kg)   SpO2 94%   BMI 38.49 kg/m    Physical Exam Vitals reviewed.  Constitutional:      General: He is not in acute distress.    Appearance: Normal appearance. He is obese. He is not ill-appearing.  HENT:     Head: Normocephalic and atraumatic.  Eyes:     Pupils: Pupils are equal, round, and reactive to light.  Cardiovascular:     Rate and Rhythm: Normal rate and regular rhythm.     Heart sounds: Normal heart sounds. No murmur heard. Pulmonary:      Effort: Pulmonary effort is normal. No respiratory distress.     Breath sounds: Normal breath sounds. No wheezing or rales.  Neurological:     Mental Status: He is alert and oriented to person, place, and time.  Psychiatric:        Mood and Affect: Mood normal.        Behavior: Behavior normal.        Assessment/Plan: 1. Pleural calcification (Primary) Repeat CT chest ordered.  - CT Chest Wo Contrast; Future  2. SSS (sick sinus syndrome) (HCC) Pacemaker placed recently. No further issues with bradycardia  3. NAFLD (nonalcoholic fatty liver disease) Continue to monitor liver function labs periodically  4. S/P placement of cardiac pacemaker Doing well since having the pacemaker placed by cardiology    General Counseling: Norleen oakland understanding of the findings of todays visit and agrees with plan of treatment. I have discussed any further diagnostic evaluation that may be needed or ordered today. We also reviewed his medications today. he has been encouraged  to call the office with any questions or concerns that should arise related to todays visit.    Orders Placed This Encounter  Procedures   CT Chest Wo Contrast    No orders of the defined types were placed in this encounter.   Return in about 4 weeks (around 06/16/2024) for F/U, Assad Harbeson PCP for CT results .   Total time spent:30 Minutes Time spent includes review of chart, medications, test results, and follow up plan with the patient.   [place] Controlled Substance Database was reviewed by me.  This patient was seen by Mardy Maxin, FNP-C in collaboration with Dr. Sigrid Bathe as a part of collaborative care agreement.   Tkeyah Burkman R. Maxin, MSN, FNP-C Internal medicine

## 2024-05-21 ENCOUNTER — Encounter: Payer: Self-pay | Admitting: Nurse Practitioner

## 2024-05-26 ENCOUNTER — Ambulatory Visit
Admission: RE | Admit: 2024-05-26 | Discharge: 2024-05-26 | Disposition: A | Discharge status: Home / Self Care | Source: Ambulatory Visit | Attending: Nurse Practitioner | Admitting: Nurse Practitioner

## 2024-05-26 DIAGNOSIS — J948 Other specified pleural conditions: Secondary | ICD-10-CM

## 2024-05-26 DIAGNOSIS — J432 Centrilobular emphysema: Secondary | ICD-10-CM | POA: Diagnosis not present

## 2024-06-14 DIAGNOSIS — I25709 Atherosclerosis of coronary artery bypass graft(s), unspecified, with unspecified angina pectoris: Secondary | ICD-10-CM | POA: Diagnosis not present

## 2024-06-14 DIAGNOSIS — Z87891 Personal history of nicotine dependence: Secondary | ICD-10-CM | POA: Diagnosis not present

## 2024-06-14 DIAGNOSIS — Z950 Presence of cardiac pacemaker: Secondary | ICD-10-CM | POA: Diagnosis not present

## 2024-06-16 ENCOUNTER — Encounter: Payer: Self-pay | Admitting: Nurse Practitioner

## 2024-06-16 ENCOUNTER — Ambulatory Visit (INDEPENDENT_AMBULATORY_CARE_PROVIDER_SITE_OTHER): Admitting: Nurse Practitioner

## 2024-06-16 VITALS — BP 120/70 | HR 72 | Temp 98.0°F | Resp 16 | Ht 71.0 in | Wt 278.6 lb

## 2024-06-16 DIAGNOSIS — I10 Essential (primary) hypertension: Secondary | ICD-10-CM

## 2024-06-16 DIAGNOSIS — J432 Centrilobular emphysema: Secondary | ICD-10-CM

## 2024-06-16 DIAGNOSIS — Z950 Presence of cardiac pacemaker: Secondary | ICD-10-CM | POA: Insufficient documentation

## 2024-06-16 DIAGNOSIS — I495 Sick sinus syndrome: Secondary | ICD-10-CM | POA: Insufficient documentation

## 2024-06-16 DIAGNOSIS — J948 Other specified pleural conditions: Secondary | ICD-10-CM | POA: Diagnosis not present

## 2024-06-16 DIAGNOSIS — K760 Fatty (change of) liver, not elsewhere classified: Secondary | ICD-10-CM | POA: Insufficient documentation

## 2024-06-16 DIAGNOSIS — J449 Chronic obstructive pulmonary disease, unspecified: Secondary | ICD-10-CM | POA: Insufficient documentation

## 2024-06-16 NOTE — Progress Notes (Signed)
 [facility] [address] [place], [place] [address]  Internal MEDICINE  Office Visit Note  Patient Name: Lee Douglas  [id number]  [id number]  Date of Service: 06/16/2024  Chief Complaint  Patient presents with   Follow-up    CT    HPI Lee Douglas presents for a follow-up visit for hypertension NAFLD, COPD and CT chest results.  Hypertension -- BP is stable and controlled with lisinopril and metoprolol NAFLD -- moderate fatty liver seen on CT scan. Negative FIB-4, NAFLD fibrosis score and APRI.  Pleural calcifications -- stable, no change on CT scan  COPD-- mild emphysema noted. Quit smoking previously but still chews tobacco. No SOB, wheezing, chest tightness or cough. Not currently on a maintenance inhaler.  SSS, S/p pacemaker, has upcoming echocardiogram soon. Followed by cardiology.     Current Medication: Outpatient Encounter Medications as of 06/16/2024  Medication Sig Note   aspirin 81 MG tablet Take by mouth. 02/20/2016: Received from: [facility]   atorvastatin (LIPITOR) 80 MG tablet  02/20/2016: Received from: External Pharmacy   clopidogrel (PLAVIX) 75 MG tablet  02/20/2016: Received from: External Pharmacy   fenofibrate (TRICOR) 145 MG tablet Take 145 mg by mouth.    icosapent Ethyl (VASCEPA) 1 g capsule Take 2 capsules by mouth 2 (two) times daily.    lisinopril (ZESTRIL) 20 MG tablet TAKE 1 TABLET BY MOUTH ONCE DAILY AND ONE-HALF TABLET  BY MOUTH AT NIGHT    metoprolol succinate (TOPROL-XL) 50 MG 24 hr tablet Take 50 mg by mouth 2 (two) times daily.    Multiple Vitamins-Minerals (MULTIVITAMIN ADULT PO) Take by mouth. 02/20/2016: Received from: [facility]   nitroGLYCERIN (NITROSTAT) 0.4 MG SL tablet  02/20/2016: Received from: External Pharmacy   pantoprazole (PROTONIX) 20 MG tablet Take 20 mg by mouth 2 (two) times daily.    pneumococcal 20-valent conjugate vaccine (PREVNAR 20) 0.5 ML injection Inject 0.5 mLs into the muscle  once as needed for up to 1 dose for immunization.    No facility-administered encounter medications on file as of 06/16/2024.    Surgical History: Past Surgical History:  Procedure Laterality Date   COLONOSCOPY WITH PROPOFOL  N/A 04/18/2020   Procedure: COLONOSCOPY WITH PROPOFOL ;  Surgeon: Lee Ole DASEN, MD;  Location: [facility] ENDOSCOPY;  Service: Endoscopy;  Laterality: N/A;   COLONOSCOPY WITH PROPOFOL  N/A 12/01/2023   Procedure: COLONOSCOPY WITH PROPOFOL ;  Surgeon: Lee Ole DASEN, MD;  Location: [facility] ENDOSCOPY;  Service: Endoscopy;  Laterality: N/A;   CORONARY ANGIOPLASTY     ESOPHAGOGASTRODUODENOSCOPY N/A 06/19/2020   Procedure: ESOPHAGOGASTRODUODENOSCOPY (EGD);  Surgeon: Lee Ole DASEN, MD;  Location: [facility] ENDOSCOPY;  Service: Endoscopy;  Laterality: N/A;   ESOPHAGOGASTRODUODENOSCOPY (EGD) WITH PROPOFOL  N/A 04/18/2020   Procedure: ESOPHAGOGASTRODUODENOSCOPY (EGD) WITH PROPOFOL ;  Surgeon: Lee Ole DASEN, MD;  Location: [facility] ENDOSCOPY;  Service: Endoscopy;  Laterality: N/A;   EXPLORATION POST OPERATIVE OPEN HEART     HERNIA REPAIR     POLYPECTOMY  12/01/2023   Procedure: POLYPECTOMY, INTESTINE;  Surgeon: Lee Ole DASEN, MD;  Location: [facility] ENDOSCOPY;  Service: Endoscopy;;   SINUS SURGERY WITH INSTATRAK      Medical History: Past Medical History:  Diagnosis Date   Coronary artery disease    Hyperlipidemia    Hypertension    Myocardial infarction [facility])     Family History: Family History  Problem Relation Age of Onset   Stroke Father     Social History   Socioeconomic History   Marital status: Married  Spouse name: Not on file   Number of children: Not on file   Years of education: Not on file   Highest education level: Not on file  Occupational History   Not on file  Tobacco Use   Smoking status: Former   Smokeless tobacco: Current    Types: Chew  Vaping Use   Vaping status: Never Used  Substance and Sexual Activity   Alcohol use: Yes   Drug  use: Never   Sexual activity: Not on file  Other Topics Concern   Not on file  Social History Narrative   Not on file   Social Drivers of Health   Financial Resource Strain: Not on file  Food Insecurity: Not on file  Transportation Needs: Not on file  Physical Activity: Not on file  Stress: Not on file  Social Connections: Not on file  Intimate Partner Violence: Not on file      Review of Systems  Constitutional:  Negative for activity change, appetite change, chills, fatigue, fever and unexpected weight change.  HENT: Negative.  Negative for congestion, ear pain, rhinorrhea, sore throat and trouble swallowing.   Eyes: Negative.   Respiratory: Negative.  Negative for cough, chest tightness, shortness of breath and wheezing.   Cardiovascular: Negative.  Negative for chest pain and palpitations.  Gastrointestinal: Negative.  Negative for abdominal pain, blood in stool, constipation, diarrhea, nausea and vomiting.  Endocrine: Negative.   Genitourinary: Negative.  Negative for difficulty urinating, dysuria, frequency, hematuria and urgency.  Musculoskeletal: Negative.  Negative for arthralgias, back pain, joint swelling, myalgias and neck pain.  Skin: Negative.  Negative for rash and wound.  Allergic/Immunologic: Negative.  Negative for immunocompromised state.  Neurological: Negative.  Negative for dizziness, seizures, numbness and headaches.  Hematological: Negative.   Psychiatric/Behavioral: Negative.  Negative for behavioral problems, self-injury and suicidal ideas. The patient is not nervous/anxious.     Vital Signs: BP 120/70   Pulse 72   Temp 98 F (36.7 C)   Resp 16   Ht 5' 11 (1.803 m)   Wt 278 lb 9.6 oz (126.4 kg)   SpO2 94%   BMI 38.86 kg/m    Physical Exam Vitals reviewed.  Constitutional:      General: He is not in acute distress.    Appearance: Normal appearance. He is obese. He is not ill-appearing.  HENT:     Head: Normocephalic and atraumatic.   Eyes:     Pupils: Pupils are equal, round, and reactive to light.  Cardiovascular:     Rate and Rhythm: Normal rate and regular rhythm.     Heart sounds: Normal heart sounds. No murmur heard. Pulmonary:     Effort: Pulmonary effort is normal. No respiratory distress.     Breath sounds: Normal breath sounds. No wheezing or rales.  Neurological:     Mental Status: He is alert and oriented to person, place, and time.  Psychiatric:        Mood and Affect: Mood normal.        Behavior: Behavior normal.        Assessment/Plan: 1. Centrilobular emphysema (HCC) (Primary) Mild, no issues at this time. Not currently on any maintenance inhaler and has not needed any rescue inhaler. Former smoker.   2. Pleural calcification No change on repeat CT chest, will continue to monitor with annual CT chest   3. Primary hypertension Stable, continue lisinopril and metoprolol as prescribed.   4. SSS (sick sinus syndrome) (HCC) Corrected with pacemaker, followed  by cardiology. Has upcoming echocardiogram.   5. NAFLD (nonalcoholic fatty liver disease) Moderate, seen on CT scan. Fibrosis is unlikely. Will continue to monitor.   6. Obesity, morbid (HCC) Elevated BMI with central adiposity. Patient has comorbidities including hypertension, fatty liver and elevated cholesterol.   7. S/P placement of cardiac pacemaker Doing well, managed by cardiology, has upcoming echocardiogram.    General Counseling: Norleen oakland understanding of the findings of todays visit and agrees with plan of treatment. I have discussed any further diagnostic evaluation that may be needed or ordered today. We also reviewed his medications today. he has been encouraged to call the office with any questions or concerns that should arise related to todays visit.    No orders of the defined types were placed in this encounter.   No orders of the defined types were placed in this encounter.   Return for previously  scheduled, AWV, Alizon Schmeling PCP in june and otherwise as needed. .   Total time spent:30 Minutes Time spent includes review of chart, medications, test results, and follow up plan with the patient.   [place] Controlled Substance Database was reviewed by me.  This patient was seen by Mardy Maxin, FNP-C in collaboration with Dr. Sigrid Bathe as a part of collaborative care agreement.   Doyne Micke R. Maxin, MSN, FNP-C Internal medicine

## 2024-06-18 DIAGNOSIS — Z45018 Encounter for adjustment and management of other part of cardiac pacemaker: Secondary | ICD-10-CM | POA: Diagnosis not present

## 2024-06-21 DIAGNOSIS — Z950 Presence of cardiac pacemaker: Secondary | ICD-10-CM | POA: Diagnosis not present

## 2024-06-21 DIAGNOSIS — Z0189 Encounter for other specified special examinations: Secondary | ICD-10-CM | POA: Diagnosis not present

## 2024-06-22 DIAGNOSIS — I493 Ventricular premature depolarization: Secondary | ICD-10-CM | POA: Diagnosis not present

## 2024-06-22 DIAGNOSIS — I491 Atrial premature depolarization: Secondary | ICD-10-CM | POA: Diagnosis not present

## 2024-06-22 DIAGNOSIS — I2510 Atherosclerotic heart disease of native coronary artery without angina pectoris: Secondary | ICD-10-CM | POA: Diagnosis not present

## 2024-06-22 DIAGNOSIS — I517 Cardiomegaly: Secondary | ICD-10-CM | POA: Diagnosis not present

## 2024-06-22 DIAGNOSIS — Z950 Presence of cardiac pacemaker: Secondary | ICD-10-CM | POA: Diagnosis not present

## 2024-06-22 DIAGNOSIS — I4729 Other ventricular tachycardia: Secondary | ICD-10-CM | POA: Diagnosis not present

## 2024-06-22 DIAGNOSIS — I455 Other specified heart block: Secondary | ICD-10-CM | POA: Diagnosis not present

## 2024-06-22 DIAGNOSIS — I495 Sick sinus syndrome: Secondary | ICD-10-CM | POA: Diagnosis not present

## 2024-07-06 DIAGNOSIS — I25110 Atherosclerotic heart disease of native coronary artery with unstable angina pectoris: Secondary | ICD-10-CM | POA: Diagnosis not present

## 2024-07-06 DIAGNOSIS — Z950 Presence of cardiac pacemaker: Secondary | ICD-10-CM | POA: Diagnosis not present

## 2024-07-06 DIAGNOSIS — Z951 Presence of aortocoronary bypass graft: Secondary | ICD-10-CM | POA: Diagnosis not present

## 2024-07-06 DIAGNOSIS — I200 Unstable angina: Secondary | ICD-10-CM | POA: Diagnosis not present

## 2024-07-06 DIAGNOSIS — I471 Supraventricular tachycardia, unspecified: Secondary | ICD-10-CM | POA: Diagnosis not present

## 2024-07-06 DIAGNOSIS — Z87891 Personal history of nicotine dependence: Secondary | ICD-10-CM | POA: Diagnosis not present

## 2024-07-06 DIAGNOSIS — I493 Ventricular premature depolarization: Secondary | ICD-10-CM | POA: Diagnosis not present

## 2024-07-06 DIAGNOSIS — R931 Abnormal findings on diagnostic imaging of heart and coronary circulation: Secondary | ICD-10-CM | POA: Diagnosis not present

## 2024-07-11 DIAGNOSIS — E785 Hyperlipidemia, unspecified: Secondary | ICD-10-CM | POA: Diagnosis not present

## 2024-07-11 DIAGNOSIS — I2510 Atherosclerotic heart disease of native coronary artery without angina pectoris: Secondary | ICD-10-CM | POA: Diagnosis not present

## 2024-07-11 DIAGNOSIS — I455 Other specified heart block: Secondary | ICD-10-CM | POA: Diagnosis not present

## 2024-07-11 DIAGNOSIS — Z950 Presence of cardiac pacemaker: Secondary | ICD-10-CM | POA: Diagnosis not present

## 2024-07-11 DIAGNOSIS — I493 Ventricular premature depolarization: Secondary | ICD-10-CM | POA: Diagnosis not present

## 2024-08-02 DIAGNOSIS — I25119 Atherosclerotic heart disease of native coronary artery with unspecified angina pectoris: Secondary | ICD-10-CM | POA: Diagnosis not present

## 2024-08-02 DIAGNOSIS — R7303 Prediabetes: Secondary | ICD-10-CM | POA: Diagnosis not present

## 2024-08-02 DIAGNOSIS — E785 Hyperlipidemia, unspecified: Secondary | ICD-10-CM | POA: Diagnosis not present

## 2025-02-16 ENCOUNTER — Ambulatory Visit: Admitting: Nurse Practitioner

## 2025-02-17 ENCOUNTER — Ambulatory Visit: Admitting: Nurse Practitioner
